# Patient Record
Sex: Female | Born: 1960 | Race: Black or African American | Hispanic: No | Marital: Married | Smoking: Never smoker
Health system: Southern US, Community
[De-identification: ages and names within clinical notes are randomized; demographics above are authoritative.]

## PROBLEM LIST (undated history)

## (undated) HISTORY — PX: TUBAL LIGATION: SHX77

## (undated) HISTORY — PX: DILATION AND CURETTAGE OF UTERUS: SHX78

---

## 1997-10-23 ENCOUNTER — Inpatient Hospital Stay (HOSPITAL_COMMUNITY): Admission: AD | Admit: 1997-10-23 | Discharge: 1997-10-23 | Payer: Self-pay | Admitting: *Deleted

## 1997-10-28 ENCOUNTER — Inpatient Hospital Stay (HOSPITAL_COMMUNITY): Admission: AD | Admit: 1997-10-28 | Discharge: 1997-10-28 | Payer: Self-pay | Admitting: *Deleted

## 1997-11-06 ENCOUNTER — Inpatient Hospital Stay (HOSPITAL_COMMUNITY): Admission: AD | Admit: 1997-11-06 | Discharge: 1997-11-06 | Payer: Self-pay | Admitting: Obstetrics

## 1997-11-15 ENCOUNTER — Inpatient Hospital Stay (HOSPITAL_COMMUNITY): Admission: AD | Admit: 1997-11-15 | Discharge: 1997-11-15 | Payer: Self-pay | Admitting: *Deleted

## 1997-11-19 ENCOUNTER — Inpatient Hospital Stay (HOSPITAL_COMMUNITY): Admission: AD | Admit: 1997-11-19 | Discharge: 1997-11-19 | Payer: Self-pay | Admitting: Obstetrics & Gynecology

## 1997-11-24 ENCOUNTER — Encounter (HOSPITAL_COMMUNITY): Admission: RE | Admit: 1997-11-24 | Discharge: 1997-12-03 | Payer: Self-pay | Admitting: *Deleted

## 1997-12-01 ENCOUNTER — Inpatient Hospital Stay (HOSPITAL_COMMUNITY): Admission: AD | Admit: 1997-12-01 | Discharge: 1997-12-05 | Payer: Self-pay | Admitting: *Deleted

## 1999-12-18 ENCOUNTER — Emergency Department (HOSPITAL_COMMUNITY): Admission: EM | Admit: 1999-12-18 | Discharge: 1999-12-18 | Payer: Self-pay | Admitting: Emergency Medicine

## 1999-12-18 ENCOUNTER — Encounter: Payer: Self-pay | Admitting: Emergency Medicine

## 2000-01-20 ENCOUNTER — Inpatient Hospital Stay (HOSPITAL_COMMUNITY): Admission: AD | Admit: 2000-01-20 | Discharge: 2000-01-20 | Payer: Self-pay | Admitting: *Deleted

## 2000-01-21 ENCOUNTER — Ambulatory Visit (HOSPITAL_COMMUNITY): Admission: RE | Admit: 2000-01-21 | Discharge: 2000-01-21 | Payer: Self-pay | Admitting: *Deleted

## 2000-01-21 ENCOUNTER — Encounter (INDEPENDENT_AMBULATORY_CARE_PROVIDER_SITE_OTHER): Payer: Self-pay | Admitting: Specialist

## 2000-01-21 ENCOUNTER — Encounter: Payer: Self-pay | Admitting: *Deleted

## 2000-01-27 ENCOUNTER — Inpatient Hospital Stay (HOSPITAL_COMMUNITY): Admission: AD | Admit: 2000-01-27 | Discharge: 2000-01-27 | Payer: Self-pay | Admitting: Obstetrics and Gynecology

## 2000-02-11 ENCOUNTER — Ambulatory Visit (HOSPITAL_COMMUNITY): Admission: AD | Admit: 2000-02-11 | Discharge: 2000-02-11 | Payer: Self-pay | Admitting: *Deleted

## 2000-02-11 ENCOUNTER — Encounter (INDEPENDENT_AMBULATORY_CARE_PROVIDER_SITE_OTHER): Payer: Self-pay

## 2001-03-23 ENCOUNTER — Emergency Department (HOSPITAL_COMMUNITY): Admission: EM | Admit: 2001-03-23 | Discharge: 2001-03-23 | Payer: Self-pay | Admitting: Emergency Medicine

## 2001-05-14 ENCOUNTER — Encounter: Admission: RE | Admit: 2001-05-14 | Discharge: 2001-05-14 | Payer: Self-pay | Admitting: Obstetrics & Gynecology

## 2002-12-01 ENCOUNTER — Encounter: Payer: Self-pay | Admitting: Family Medicine

## 2002-12-01 ENCOUNTER — Inpatient Hospital Stay (HOSPITAL_COMMUNITY): Admission: AD | Admit: 2002-12-01 | Discharge: 2002-12-01 | Payer: Self-pay | Admitting: Family Medicine

## 2003-01-29 ENCOUNTER — Inpatient Hospital Stay (HOSPITAL_COMMUNITY): Admission: AD | Admit: 2003-01-29 | Discharge: 2003-01-29 | Payer: Self-pay | Admitting: *Deleted

## 2003-02-17 ENCOUNTER — Ambulatory Visit (HOSPITAL_COMMUNITY): Admission: RE | Admit: 2003-02-17 | Discharge: 2003-02-17 | Payer: Self-pay | Admitting: Obstetrics and Gynecology

## 2003-04-27 ENCOUNTER — Encounter: Payer: Self-pay | Admitting: *Deleted

## 2003-04-27 ENCOUNTER — Ambulatory Visit (HOSPITAL_COMMUNITY): Admission: RE | Admit: 2003-04-27 | Discharge: 2003-04-27 | Payer: Self-pay | Admitting: *Deleted

## 2003-05-22 ENCOUNTER — Inpatient Hospital Stay (HOSPITAL_COMMUNITY): Admission: AD | Admit: 2003-05-22 | Discharge: 2003-05-22 | Payer: Self-pay | Admitting: Obstetrics & Gynecology

## 2003-06-05 ENCOUNTER — Ambulatory Visit (HOSPITAL_COMMUNITY): Admission: RE | Admit: 2003-06-05 | Discharge: 2003-06-05 | Payer: Self-pay | Admitting: *Deleted

## 2003-06-09 ENCOUNTER — Inpatient Hospital Stay (HOSPITAL_COMMUNITY): Admission: AD | Admit: 2003-06-09 | Discharge: 2003-06-09 | Payer: Self-pay | Admitting: *Deleted

## 2003-06-28 ENCOUNTER — Inpatient Hospital Stay (HOSPITAL_COMMUNITY): Admission: AD | Admit: 2003-06-28 | Discharge: 2003-06-28 | Payer: Self-pay | Admitting: Obstetrics and Gynecology

## 2003-07-21 ENCOUNTER — Encounter: Admission: RE | Admit: 2003-07-21 | Discharge: 2003-07-21 | Payer: Self-pay | Admitting: *Deleted

## 2003-07-22 ENCOUNTER — Inpatient Hospital Stay (HOSPITAL_COMMUNITY): Admission: RE | Admit: 2003-07-22 | Discharge: 2003-07-26 | Payer: Self-pay | Admitting: *Deleted

## 2003-07-23 ENCOUNTER — Encounter (INDEPENDENT_AMBULATORY_CARE_PROVIDER_SITE_OTHER): Payer: Self-pay

## 2004-04-11 ENCOUNTER — Emergency Department (HOSPITAL_COMMUNITY): Admission: EM | Admit: 2004-04-11 | Discharge: 2004-04-12 | Payer: Self-pay | Admitting: *Deleted

## 2005-04-04 ENCOUNTER — Emergency Department (HOSPITAL_COMMUNITY): Admission: EM | Admit: 2005-04-04 | Discharge: 2005-04-04 | Payer: Self-pay | Admitting: Family Medicine

## 2006-06-09 ENCOUNTER — Emergency Department (HOSPITAL_COMMUNITY): Admission: EM | Admit: 2006-06-09 | Discharge: 2006-06-09 | Payer: Self-pay | Admitting: Emergency Medicine

## 2006-10-20 ENCOUNTER — Emergency Department (HOSPITAL_COMMUNITY): Admission: EM | Admit: 2006-10-20 | Discharge: 2006-10-20 | Payer: Self-pay | Admitting: Emergency Medicine

## 2007-02-04 ENCOUNTER — Emergency Department (HOSPITAL_COMMUNITY): Admission: EM | Admit: 2007-02-04 | Discharge: 2007-02-05 | Payer: Self-pay | Admitting: Emergency Medicine

## 2011-01-13 NOTE — Op Note (Addendum)
NAME:  Angelena Form NO.:  [id number]   MEDICAL RECORD NO.:  [id number]                   PATIENT TYPE:  INP   LOCATION:  9141                                 FACILITY:  [facility]   PHYSICIAN:  Lesly Dukes, M.D.              DATE OF BIRTH:  1961-05-30   DATE OF PROCEDURE:  07/23/2003  DATE OF DISCHARGE:                                 OPERATIVE REPORT   PREOPERATIVE DIAGNOSIS:  Para 5-0-2-5 at 40+ weeks estimated gestational  age, with failed VBAC, LGA and fetus.   POSTOPERATIVE DIAGNOSIS:  Para 5-0-2-5 at 40+ weeks estimated gestational  age, with failed VBAC, LGA and fetus.   PROCEDURE:  Repeat low-flap transverse cesarean section with a bilateral  tubal ligation.   SURGEON:  Lesly Dukes, M.D.   ASSISTANT:  Carrington Clamp, M.D.   ESTIMATED BLOOD LOSS:  1000 cc.   COMPLICATIONS:  None.  ____ QA MARKER: 31 ____        None.                                               Lesly Dukes, M.D.    Lora Paula  D:  07/23/2003  T:  07/23/2003  Job:  [id number]

## 2011-01-13 NOTE — Op Note (Signed)
[facility]  Patient:    Vanessa Dodson                    MRN: [id number] Proc. Date: 02/01/00 Adm. Date:  [id number] Disc. Date: [id number] Attending:  Michaelle Copas                           Operative Report  PREOPERATIVE DIAGNOSIS:       Incomplete abortion.  POSTOPERATIVE DIAGNOSIS:      Incomplete abortion.  PROCEDURE:                    Suction curettage.  SURGEON:                      Miguel Aschoff, M.D.  ANESTHESIA:                   IV sedation and paracervical block.  COMPLICATIONS:                None.  JUSTIFICATION:                The patient is a 50 year old black female who has been followed at the outpatient area because of suspected spontaneous abortion.  She was noted to have falling hCG, which fell from 24,712 down to 330.  However, she had continued to have heavy bleeding, passing clots of blood and soaking a pad every 1-2 hours.  In view of the persistent bleeding, she is suspected to have retained products of conception.  She presents now to undergo suction curettage.  DESCRIPTION OF PROCEDURE:     The patient was taken to the operating room and placed in the supine position.  IV sedation was achieved without difficulty. She was then placed in the dorsal lithotomy position, prepped and draped in the usual sterile fashion.  The external genitalia were noted to be within normal limits.  A speculum was placed in the vaginal vault.  A moderate amount of bleeding was noted.  The cervical os was open.  The uterus was examined. It was noted to be top normal size and anterior.  No adnexal masses were noted.  The cervix was then injected with 10 cc of 1% Xylocaine by placing 3.5 cc at the 12, 4 and 8 oclock positions.  The anterior cervical lip was grasped with a tenaculum.  A #29 Pratt dilator was placed through the cervix, which was noted to be previously open.  A #9 vacuum curet was placed in the uterine cavity and the  contents were evacuated without difficulty.   This yielded a moderate amount of what appeared to be normal appearing retained products of conception.  Following suction curettage, sharp curettage was carried out without difficulty with no visible amount of additional tissue being found.  A final pass was made with the suction curet.  At this point, all instruments were removed.  Hemostasis was readily achieved.  The procedure was completed.  Estimated blood loss for the procedure was approximately 60 cc.  The patient tolerated the procedure well.  Plans are for the patient to be discharged to home.  Medications will include doxycycline 100 mg bis x 4 days and Darvocet-N 100, one q.4-6h. p.r.n. pain.  She is to call for any problems such as fever, pain or heavy bleeding.  She will be seen back in four weeks for a follow-up examination. DD:  02/11/00 TD:  02/14/00 Job: [id number]

## 2011-01-13 NOTE — Discharge Summary (Signed)
NAME:  Lemont Fillers                     ACCOUNT NO.:  [id number]   MEDICAL RECORD NO.:  [id number]                   PATIENT TYPE:  INP   LOCATION:  9141                                 FACILITY:  [facility]   PHYSICIAN:  Conni Elliot, M.D.             DATE OF BIRTH:  25-Dec-1960   DATE OF ADMISSION:  07/22/2003  DATE OF DISCHARGE:  07/26/2003                                 DISCHARGE SUMMARY   DISCHARGE DIAGNOSES:  1. Repeat low transverse cesarean section secondary to large for gestational     age infant and secondary to no cervical change for three hours.  2. Tubal ligation.   DISCHARGE MEDICATIONS:  1. Percocet 5/325 p.o. q.4-6h. p.r.n. #20.  2. Ibuprofen 600 mg one p.o. q.6h. p.r.n. #30.   DISPOSITION:  The patient discharged to home.   FOLLOWUP:  The patient is to follow up at [facility] in six weeks.   BRIEF ADMISSION HISTORY:  A 50 year old G9, P6-0-3-6 now admitted from  clinic for induction for large gestational age and post dates at [redacted] weeks  gestational age.  The patient was started on low dose Pitocin.  Had no  cervical change for a three hour period.  Failed vaginal birth and decided  to proceed to repeat low transverse cesarean section __________ tubal  ligation.  The patient delivered a viable female infant with Apgars of 9 and 9  at one and five minutes, respectively weighing 10 pounds 3 ounces and 54.5  cm in length.  The patient had some postoperative vertical nystagmus which  was believed by anesthesia to be secondary to Robinul.  It resolved in the  PACU and the patient had no further episodes of this during hospitalization.  The patient is breast-feeding with bottle supplementation.  Child was  circumcised before discharge.  Discharge hemoglobin was 10.4.  Therefore,  patient was not started on iron.  The patient's blood type O+.  Rubella  immune.  The patient was group B Strep positive with this pregnancy.     Anastasio Auerbach, MD                           Conni Elliot, M.D.    AD/MEDQ  D:  07/26/2003  T:  07/26/2003  Job:  [id number]

## 2011-01-13 NOTE — Op Note (Signed)
NAME:  Vanessa Dodson                     ACCOUNT NO.:  [id number]   MEDICAL RECORD NO.:  [id number]                   PATIENT TYPE:  INP   LOCATION:  9141                                 FACILITY:  [facility]   PHYSICIAN:  Lesly Dukes, M.D.              DATE OF BIRTH:  04-09-1961   DATE OF PROCEDURE:  07/23/2003  DATE OF DISCHARGE:  07/26/2003                                 OPERATIVE REPORT   REDICTATION:   PREOPERATIVE DIAGNOSIS:  A para 5-0-2-5 at 40+ weeks estimated gestational  age with a failed VBAC.   POSTOPERATIVE DIAGNOSIS:  A para 5-0-2-5 at 40+ weeks estimated gestational  age with a failed VBAC.   PROCEDURE:  Repeat low flap transverse cesarean section and BTL via the  Pomeroy method.   SURGEON:  Lesly Dukes, M.D.   ASSISTANT:  Carrington Clamp, M.D.   ESTIMATED BLOOD LOSS:  1000 mL.   COMPLICATIONS:  None.   PATHOLOGY:  Portions of bilateral fallopian tubes.   FINDINGS:  Viable female infant, weighing 10 pounds, 3 ounces, vertex, normal  placenta grossly with three-vessel cord, clear delivery.   DESCRIPTION OF PROCEDURE:  After informed consent was obtained, the patient  was taken to the operating room where spinal anesthesia was found to be  adequate.  The patient was placed in the dorsal supine position and prepared  and draped in the normal sterile fashion.  Foley was in the bladder.  A  Pfannenstiel skin incision was made with the scalpel and carried down to the  under layer of fascia.  The fascia was incised in the midline and extended  bilaterally.  Superior and inferior aspects of the fascial incisions were  grasped with Kocher clamps, tented up, and dissected off sharply and  bluntly.  The underlying layers of rectus muscles were separated in the  midline.  The peritoneum was identified, tented up, and entered sharply with  Metzenbaum scissors.  This incision was extended both superiorly and  inferiorly with good visualization of the bladder.   The bladder blade was  inserted.  The vesicouterine peritoneum was identified, tented up, and  entered sharply with the Metzenbaum scissors.  This incision was extended  bilaterally, and a bladder flap was created digitally.  The bladder blade  was then inserted.  The lower uterine segment was incised in a transverse  fashion with a scalpel.  This was extended bilaterally with the bandage  scissors.  The baby's head delivered without incident.  The nose and mouth  were suctioned.  The rest of the body of the baby delivered easily.  The  cord was clamped and cut, and the baby was handed off to the awaiting  pediatrician.  Cord blood was sent for type and screen.  Placenta was  delivered spontaneously and had a three-vessel cord.  The uterus was  exteriorized and cleared of all clots and debris.  The uterine incision was  closed with  0 Vicryl in a running locked fashion.  Excellent hemostasis was  noted.  The tubal was then begun.  The right fallopian tube was tented up  with a Babcock clamp, doubly ligated with 0 plain suture, transected, and  found to be hemostatic.  The fallopian tube was tented up, its middle  portion brought up to the fimbriated end.  It was doubly ligated with 0  Vicryl and transected and also found to be hemostatic.  The portions of the  fallopian tubes were sent to pathology.  The uterus was returned to the  abdomen and noted to be hemostatic off tension.  The peritoneum was closed  with 2 chromic in a running fashion.  The fascia was closed with 0 Vicryl in  a running fashion.  Good hemostasis was noted in all layers.  Subcutaneous  tissue was copiously irrigated; skin was closed with staples.  The patient  tolerated the procedure well.  Sponge, lap, instrument, and needle counts  were correct x2.  The patient went to the recovery room in stable condition.                                               Lesly Dukes, M.D.    Lora Paula  D:  09/06/2003  T:   09/06/2003  Job:  [id number]

## 2011-06-15 LAB — URINALYSIS, ROUTINE W REFLEX MICROSCOPIC
Bilirubin Urine: NEGATIVE
Glucose, UA: NEGATIVE
Leukocytes, UA: NEGATIVE
Nitrite: NEGATIVE
Specific Gravity, Urine: 1.006
pH: 6.5

## 2011-06-15 LAB — BASIC METABOLIC PANEL
Chloride: 108
GFR calc non Af Amer: >60
Glucose, Bld: 97
Potassium: 3.7
Sodium: 141

## 2011-06-15 LAB — CBC
HCT: 38.1
Hemoglobin: 12.9
MCV: 93.5
WBC: 5.6

## 2011-06-15 LAB — PREGNANCY, URINE: Preg Test, Ur: NEGATIVE

## 2012-09-29 ENCOUNTER — Encounter (HOSPITAL_COMMUNITY): Payer: Self-pay

## 2012-09-29 ENCOUNTER — Emergency Department (HOSPITAL_COMMUNITY)
Admission: EM | Admit: 2012-09-29 | Discharge: 2012-09-29 | Disposition: A | Discharge status: Home / Self Care | Payer: Self-pay | Attending: Emergency Medicine | Admitting: Emergency Medicine

## 2012-09-29 DIAGNOSIS — Z9889 Other specified postprocedural states: Secondary | ICD-10-CM | POA: Insufficient documentation

## 2012-09-29 DIAGNOSIS — N898 Other specified noninflammatory disorders of vagina: Secondary | ICD-10-CM | POA: Insufficient documentation

## 2012-09-29 DIAGNOSIS — Z3202 Encounter for pregnancy test, result negative: Secondary | ICD-10-CM | POA: Insufficient documentation

## 2012-09-29 DIAGNOSIS — N939 Abnormal uterine and vaginal bleeding, unspecified: Secondary | ICD-10-CM

## 2012-09-29 DIAGNOSIS — A5901 Trichomonal vulvovaginitis: Secondary | ICD-10-CM | POA: Insufficient documentation

## 2012-09-29 DIAGNOSIS — N949 Unspecified condition associated with female genital organs and menstrual cycle: Secondary | ICD-10-CM | POA: Insufficient documentation

## 2012-09-29 LAB — WET PREP, GENITAL: Yeast Wet Prep HPF POC: NONE SEEN

## 2012-09-29 LAB — CBC
HCT: 39.9 % (ref 36.0–46.0)
RBC: 4.31 MIL/uL (ref 3.87–5.11)
RDW: 12.3 % (ref 11.5–15.5)
WBC: 5.8 10*3/uL (ref 4.0–10.5)

## 2012-09-29 LAB — URINE MICROSCOPIC-ADD ON

## 2012-09-29 LAB — URINALYSIS, ROUTINE W REFLEX MICROSCOPIC
Glucose, UA: NEGATIVE mg/dL
Specific Gravity, Urine: 1.025 (ref 1.005–1.030)
Urobilinogen, UA: 0.2 mg/dL (ref 0.0–1.0)
pH: 5 (ref 5.0–8.0)

## 2012-09-29 LAB — PREGNANCY, URINE: Preg Test, Ur: NEGATIVE

## 2012-09-29 MED ORDER — METRONIDAZOLE 500 MG PO TABS
2000.0000 mg | ORAL_TABLET | Freq: Once | ORAL | Status: AC
  Administered 2012-09-29: 2000 mg via ORAL
  Filled 2012-09-29: qty 4

## 2012-09-29 NOTE — ED Notes (Signed)
Reports got marries in Jan 2013, did not have a normal menstrual cycle until May then started having exteremly light menstrual did not see an OBGYN. Last night abdominal cramping &  today gushing of blood with some clots.

## 2012-09-29 NOTE — ED Provider Notes (Signed)
Medical screening examination/treatment/procedure(s) were performed by non-physician practitioner and as supervising physician I was immediately available for consultation/collaboration.   Gilda Crease, MD 09/29/12 [phone]

## 2012-09-29 NOTE — ED Notes (Signed)
Patient c/o having a bloody discharge and pelvic cramping.

## 2012-09-29 NOTE — ED Provider Notes (Signed)
History     CSN: [id number]  Arrival date & time 09/29/12  1647   First MD Initiated Contact with Patient 09/29/12 1711      Chief Complaint  Patient presents with  . Vaginal Discharge  . Pelvic Pain    (Consider location/radiation/quality/duration/timing/severity/associated sxs/prior treatment) HPI Comments: Pt states that she started with abdominal cramping and vaginal bleeding today:pt states that has not had a period in six months:pt states that the blood was just running down her leg:pt denies n/v/diarrhea,fever or previous vaginal discharge  The history is provided by the patient. No language interpreter was used.    History reviewed. No pertinent past medical history.  Past Surgical History  Procedure Date  . Cesarean section   . Dilation and curettage of uterus     No family history on file.  History  Substance Use Topics  . Smoking status: Never Smoker   . Smokeless tobacco: Never Used  . Alcohol Use: No    OB History    Grav Para Term Preterm Abortions TAB SAB Ect Mult Living                  Review of Systems  Constitutional: Negative.   Respiratory: Negative.   Cardiovascular: Negative.     Allergies  Food  Home Medications  No current outpatient prescriptions on file.  BP 164/110  Pulse 66  Temp 98.2 F (36.8 C) (Oral)  Resp 20  SpO2 100%  LMP 03/29/2012  Physical Exam  Nursing note and vitals reviewed. Constitutional: She is oriented to person, place, and time. She appears well-developed and well-nourished.  HENT:  Head: Normocephalic and atraumatic.  Eyes: Conjunctivae normal and EOM are normal.  Neck: Neck supple.  Cardiovascular: Normal rate and regular rhythm.   Pulmonary/Chest: Effort normal and breath sounds normal.  Abdominal: Soft. Bowel sounds are normal. There is no tenderness.  Genitourinary:       Moderate amount of blood in the vaginal vault:-cmt  Musculoskeletal: Normal range of motion.  Neurological: She is alert  and oriented to person, place, and time.  Skin: Skin is warm and dry.  Psychiatric: She has a normal mood and affect.    ED Course  Procedures (including critical care time)  Labs Reviewed  URINALYSIS, ROUTINE W REFLEX MICROSCOPIC - Abnormal; Notable for the following:    Color, Urine RED (*)  BIOCHEMICALS MAY BE AFFECTED BY COLOR   APPearance TURBID (*)     Hgb urine dipstick LARGE (*)     Bilirubin Urine SMALL (*)     Ketones, ur TRACE (*)     Protein, ur 100 (*)     Leukocytes, UA SMALL (*)     All other components within normal limits  WET PREP, GENITAL - Abnormal; Notable for the following:    Trich, Wet Prep FEW (*)     WBC, Wet Prep HPF POC FEW (*)     All other components within normal limits  URINE MICROSCOPIC-ADD ON - Abnormal; Notable for the following:    Bacteria, UA MANY (*)     All other components within normal limits  PREGNANCY, URINE  CBC  GC/CHLAMYDIA PROBE AMP   No results found.   1. Trichomonal vaginitis   2. Vaginal bleeding problems       MDM  Discussed treatment of partners:pt treated with flagyl here        Teressa Lower, NP 09/29/12 1849

## 2012-09-30 LAB — GC/CHLAMYDIA PROBE AMP: GC Probe RNA: NEGATIVE

## 2013-05-27 ENCOUNTER — Encounter (HOSPITAL_COMMUNITY): Payer: Self-pay | Admitting: Adult Health

## 2013-05-27 ENCOUNTER — Emergency Department (HOSPITAL_COMMUNITY): Payer: No Typology Code available for payment source

## 2013-05-27 ENCOUNTER — Emergency Department (HOSPITAL_COMMUNITY)
Admission: EM | Admit: 2013-05-27 | Discharge: 2013-05-27 | Disposition: A | Discharge status: Home / Self Care | Payer: No Typology Code available for payment source | Attending: Emergency Medicine | Admitting: Emergency Medicine

## 2013-05-27 DIAGNOSIS — R5381 Other malaise: Secondary | ICD-10-CM | POA: Insufficient documentation

## 2013-05-27 DIAGNOSIS — R55 Syncope and collapse: Secondary | ICD-10-CM | POA: Insufficient documentation

## 2013-05-27 DIAGNOSIS — R0602 Shortness of breath: Secondary | ICD-10-CM | POA: Insufficient documentation

## 2013-05-27 DIAGNOSIS — R110 Nausea: Secondary | ICD-10-CM | POA: Insufficient documentation

## 2013-05-27 LAB — BASIC METABOLIC PANEL
CO2: 23 mEq/L (ref 19–32)
Glucose, Bld: 103 mg/dL — ABNORMAL HIGH (ref 70–99)
Potassium: 4.3 mEq/L (ref 3.5–5.1)
Sodium: 138 mEq/L (ref 135–145)

## 2013-05-27 LAB — CBC
Hemoglobin: 14.1 g/dL (ref 12.0–15.0)
MCH: 33 pg (ref 26.0–34.0)
RBC: 4.27 MIL/uL (ref 3.87–5.11)

## 2013-05-27 MED ORDER — NITROGLYCERIN 0.4 MG SL SUBL: 0.4000 mg | SUBLINGUAL_TABLET | SUBLINGUAL | Status: DC | PRN

## 2013-05-27 MED ORDER — SODIUM CHLORIDE 0.9 % IV BOLUS (SEPSIS)
1000.0000 mL | Freq: Once | INTRAVENOUS | Status: AC
  Administered 2013-05-27: 1000 mL via INTRAVENOUS

## 2013-05-27 NOTE — ED Notes (Signed)
Pt still receiving IV bolus, pt has about left. Pt ambulated to bathroom independently.

## 2013-05-27 NOTE — ED Notes (Addendum)
Presents with near syncope and sternal chest pressure that occurred at 16:00 today the near syncopal feeling was intermittent all day, but sitting down made it better, exertion made it worse associated with left hand numbness, SOB and nausea.  Nothing makes chest pain worse, rest and sitting down made pain better.  States, "right now I just a lot of pressure in my chest and my left arm hurts too and I have a headache" chest pressure radiates into left arm.

## 2013-05-27 NOTE — ED Provider Notes (Addendum)
  I performed a history and physical examination of Vanessa Dodson and discussed her management with Dr. Littie Deeds.  I agree with the history, physical, assessment, and plan of care, with the following exceptions: None  I was present for the following procedures: None Time Spent in Critical Care of the patient: None Time spent in discussions with the patient and family: 85  52 y.o. Female who felt weak and light headed today.  She denies chest pain as per the triage note.  She has a normal work up.    Holli Humbles, MD 05/27/13 2022  I have reviewed agree with ekg interpretation.   Hilario Quarry, MD 06/27/13 0730

## 2013-05-27 NOTE — ED Notes (Signed)
MD at bedside. 

## 2013-05-27 NOTE — ED Provider Notes (Signed)
CSN: [id number]     Arrival date & time 05/27/13  1708 History   First MD Initiated Contact with Patient 05/27/13 1805     Chief Complaint  Patient presents with  . Chest Pain   (Consider location/radiation/quality/duration/timing/severity/associated sxs/prior Treatment) Patient is a 52 y.o. female presenting with general illness.  Illness Location:  Generalized Quality:  Near syncope, shortness of breath Severity:  Moderate Onset quality:  Gradual Duration:  1 day Timing:  Constant Progression:  Worsening Chronicity:  New Context:  Spontaneous Relieved by:  Rest Worsened by:  Standing Associated symptoms: fatigue, nausea and shortness of breath   Associated symptoms: no abdominal pain, no chest pain, no congestion, no cough, no diarrhea, no fever, no headaches, no loss of consciousness, no rhinorrhea, no sore throat and no vomiting  Rash: spontaneous.     History reviewed. No pertinent past medical history. Past Surgical History  Procedure Laterality Date  . Cesarean section    . Dilation and curettage of uterus    . Tubal ligation     History reviewed. No pertinent family history. History  Substance Use Topics  . Smoking status: Never Smoker   . Smokeless tobacco: Never Used  . Alcohol Use: No   OB History   Grav Para Term Preterm Abortions TAB SAB Ect Mult Living                 Review of Systems  Constitutional: Positive for fatigue. Negative for fever and chills.  HENT: Negative for congestion, sore throat and rhinorrhea.   Eyes: Negative for photophobia and visual disturbance.  Respiratory: Positive for shortness of breath. Negative for cough.   Cardiovascular: Negative for chest pain and leg swelling.  Gastrointestinal: Positive for nausea. Negative for vomiting, abdominal pain, diarrhea and constipation.  Endocrine: Negative for polyphagia and polyuria.  Genitourinary: Negative for dysuria, flank pain, vaginal bleeding, vaginal discharge and enuresis.   Musculoskeletal: Negative for back pain and gait problem.  Skin: Negative for color change. Rash: spontaneous.  Neurological: Negative for dizziness, loss of consciousness, syncope, light-headedness, numbness and headaches.  Hematological: Negative for adenopathy. Does not bruise/bleed easily.  All other systems reviewed and are negative.    Allergies  Food  Home Medications   Current Outpatient Rx  Name  Route  Sig  Dispense  Refill  . aspirin 325 MG tablet   Oral   Take 650 mg by mouth daily as needed for pain.         . Multiple Vitamin (MULTIVITAMIN WITH MINERALS) TABS tablet   Oral   Take 1 tablet by mouth daily.          BP 169/102  Pulse 64  Temp(Src) 98.7 F (37.1 C) (Oral)  Resp 18  Wt 194 lb 1 oz (88.026 kg)  SpO2 99%  LMP 04/26/2013 Physical Exam  Vitals reviewed. Constitutional: She is oriented to person, place, and time. She appears well-developed and well-nourished.  HENT:  Head: Normocephalic and atraumatic.  Right Ear: External ear normal.  Left Ear: External ear normal.  Eyes: Conjunctivae and EOM are normal. Pupils are equal, round, and reactive to light.  Neck: Normal range of motion. Neck supple.  Cardiovascular: Normal rate, regular rhythm, normal heart sounds and intact distal pulses.   Pulmonary/Chest: Effort normal and breath sounds normal.  Abdominal: Soft. Bowel sounds are normal. There is no tenderness.  Musculoskeletal: Normal range of motion.  Neurological: She is alert and oriented to person, place, and time.  Skin: Skin is  warm and dry.    ED Course  Procedures (including critical care time) Labs Review Labs Reviewed  CBC - Abnormal; Notable for the following:    MCHC 36.4 (*)    All other components within normal limits  BASIC METABOLIC PANEL - Abnormal; Notable for the following:    Glucose, Bld 103 (*)    GFR calc non Af Amer 70 (*)    GFR calc Af Amer 81 (*)    All other components within normal limits   Imaging  Review Dg Chest 2 View  05/27/2013   *RADIOLOGY REPORT*  Clinical Data: Chest pain.  CHEST - 2 VIEW  Comparison: Chest radiograph 06/09/2006.  Findings: Stable cardiac and mediastinal contours.  No consolidative pulmonary opacities.  No pleural effusion or pneumothorax.  Regional skeleton is unremarkable.  IMPRESSION: No acute cardiopulmonary process.   Original Report Authenticated By: Annia Belt, M.D    Date: 05/27/2013  Rate: 96  Rhythm: normal sinus rhythm  QRS Axis: normal  Intervals: normal  ST/T Wave abnormalities: normal  Conduction Disutrbances:none  Narrative Interpretation: NSR without acute changes  Old EKG Reviewed: none available    MDM   1. Near syncope    52 y.o. 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  Pt had antecedent malaise and lightheadedness which progressed to near syncope, generalized weakness, and dyspnea described as chest tightness.  Pt denies chest pain, focal neuro complaints, vomiting, diarrhea, or pain.  She states that the current episode was like those she has had in the past, usually due to not drinking enough water and have been recurring since childhood, but denies any previous workup including holter monitoring.  Pt given NS bolus with improvement in symptoms, labs and ecg as above unremarkable.  Stable to dc home with pcp fu for possible holter monitoring.  Likely dehydration, also discussed possible etiology of cardiac arrhythmia or anxiety attack.  Given standard return precautions.     Labs and imaging as above reviewed by myself and attending,Dr. Rosalia Hammers, with whom case was discussed.   1. Near syncope         Noel Gerold, MD 05/27/13 2014

## 2013-06-02 ENCOUNTER — Encounter (HOSPITAL_COMMUNITY): Payer: Self-pay

## 2013-06-02 ENCOUNTER — Emergency Department (HOSPITAL_COMMUNITY)
Admission: EM | Admit: 2013-06-02 | Discharge: 2013-06-02 | Disposition: A | Discharge status: Home / Self Care | Payer: No Typology Code available for payment source | Attending: Emergency Medicine | Admitting: Emergency Medicine

## 2013-06-02 ENCOUNTER — Emergency Department (HOSPITAL_COMMUNITY): Payer: No Typology Code available for payment source

## 2013-06-02 DIAGNOSIS — R5381 Other malaise: Secondary | ICD-10-CM | POA: Insufficient documentation

## 2013-06-02 DIAGNOSIS — H53149 Visual discomfort, unspecified: Secondary | ICD-10-CM | POA: Insufficient documentation

## 2013-06-02 DIAGNOSIS — R002 Palpitations: Secondary | ICD-10-CM | POA: Insufficient documentation

## 2013-06-02 DIAGNOSIS — Z79899 Other long term (current) drug therapy: Secondary | ICD-10-CM | POA: Insufficient documentation

## 2013-06-02 DIAGNOSIS — R51 Headache: Secondary | ICD-10-CM | POA: Insufficient documentation

## 2013-06-02 DIAGNOSIS — F411 Generalized anxiety disorder: Secondary | ICD-10-CM | POA: Insufficient documentation

## 2013-06-02 MED ORDER — SODIUM CHLORIDE 0.9 % IV BOLUS (SEPSIS)
1000.0000 mL | Freq: Once | INTRAVENOUS | Status: AC
  Administered 2013-06-02: 1000 mL via INTRAVENOUS

## 2013-06-02 MED ORDER — PROCHLORPERAZINE EDISYLATE 5 MG/ML IJ SOLN
10.0000 mg | Freq: Once | INTRAMUSCULAR | Status: AC
  Administered 2013-06-02: 10 mg via INTRAVENOUS
  Filled 2013-06-02: qty 2

## 2013-06-02 MED ORDER — KETOROLAC TROMETHAMINE 30 MG/ML IJ SOLN
30.0000 mg | Freq: Once | INTRAMUSCULAR | Status: AC
  Administered 2013-06-02: 30 mg via INTRAVENOUS
  Filled 2013-06-02: qty 1

## 2013-06-02 NOTE — ED Notes (Addendum)
Patient states she was seen at [facility] ED for palpitations, SOB 3 days ago and now c/o right sided headache and numbness of her fingertips. Photophobia and noise make the headache worse.Marland Kitchen

## 2013-06-02 NOTE — ED Notes (Signed)
Patient denies international travel or being around anyone who has.

## 2013-06-02 NOTE — ED Provider Notes (Addendum)
CSN: [id number]     Arrival date & time 06/02/13  1191 History   First MD Initiated Contact with Patient 06/02/13 1121     Chief Complaint  Patient presents with  . Headache  . Weakness   (Consider location/radiation/quality/duration/timing/severity/associated sxs/prior Treatment) The history is provided by the patient.  Vanessa Dodson is a 52 y.o. female here presenting with headaches and palpitations. Was seen in the ER about week of palpitations. She had normal labs and sent home. Discharge from the ER she been having intermittent right-sided headaches and some photophobia. It's been intermittent and has not gone away. The right side of her head going down to her neck. Denies any fever chills. States that her right ear hurts a little as well.    History reviewed. No pertinent past medical history. Past Surgical History  Procedure Laterality Date  . Cesarean section    . Dilation and curettage of uterus    . Tubal ligation     Family History  Problem Relation Age of Onset  . Hypertension Mother   . Diabetes Other    History  Substance Use Topics  . Smoking status: Never Smoker   . Smokeless tobacco: Never Used  . Alcohol Use: No   OB History   Grav Para Term Preterm Abortions TAB SAB Ect Mult Living                 Review of Systems  Neurological: Positive for headaches.  All other systems reviewed and are negative.    Allergies  Food  Home Medications   Current Outpatient Rx  Name  Route  Sig  Dispense  Refill  . aspirin 325 MG tablet   Oral   Take 650 mg by mouth daily as needed for pain.         . Multiple Vitamin (MULTIVITAMIN WITH MINERALS) TABS tablet   Oral   Take 1 tablet by mouth daily.          BP 143/91  Pulse 66  Temp(Src) 98.6 F (37 C) (Oral)  Resp 16  Ht 5\' 7"  (1.702 m)  Wt 194 lb (87.998 kg)  BMI 30.38 kg/m2  SpO2 100%  LMP 06/01/2013 Physical Exam  Nursing note and vitals reviewed. Constitutional: She is  oriented to person, place, and time. She appears well-developed and well-nourished.  Anxious   HENT:  Head: Normocephalic and atraumatic.  Right Ear: External ear normal.  Left Ear: External ear normal.  Mouth/Throat: Oropharynx is clear and moist.  No evidence of periapical abscess in mouth. No tenderness at TMJ.   Eyes: Conjunctivae are normal. Pupils are equal, round, and reactive to light.  Neck: Normal range of motion. Neck supple.  Cardiovascular: Normal rate, regular rhythm and normal heart sounds.   Pulmonary/Chest: Effort normal and breath sounds normal. No respiratory distress. She has no wheezes. She has no rales.  Abdominal: Soft. Bowel sounds are normal. She exhibits no distension. There is no tenderness. There is no rebound and no guarding.  Musculoskeletal: Normal range of motion.  Neurological: She is alert and oriented to person, place, and time.  Skin: Skin is warm and dry.  Psychiatric: She has a normal mood and affect. Her behavior is normal. Judgment and thought content normal.    ED Course  Procedures (including critical care time) Labs Review Labs Reviewed - No data to display Imaging Review Ct Head Wo Contrast  06/02/2013   CLINICAL DATA:  Severe posterior headache.  EXAM: CT  HEAD WITHOUT CONTRAST  TECHNIQUE: Contiguous axial images were obtained from the base of the skull through the vertex without intravenous contrast.  COMPARISON:  CT head without contrast the 04/12/2004.  FINDINGS: No acute cortical infarct, hemorrhage, or mass lesion is present. Subcortical white matter changes are or prominent posterior to the atria of the lateral ventricles. This is similar to the prior exam. The paranasal sinuses and mastoid air cells are clear. The osseous skull is intact.  IMPRESSION: 1. Subcortical white matter hypoattenuation a is predominantly posterior, raising the possibility posterior reversible encephalopathy syndrome. 2. No other acute abnormality.   Electronically  Signed   By: Gennette Pac M.D.   On: 06/02/2013 12:46    MDM  No diagnosis found. Vanessa Dodson is a 52 y.o. female here with tension headache. I am not concerned for subarachnoid. I think her symptoms are exacerbated by anxiety, just as a week ago. She is requesting imaging. I counseled her that imaging is not necessary and that she should get MRI outpatient and neuro f/u. She wanted a CT after understanding and risks and benefits. Will give headache cocktail as well.  She also still has palpitations and I told her that she should get outpatient holter monitor.   2:08 PM CT showed chronic changes. No signs of acute stroke. Possible PRES syndrome. However, her BP was in the 140s in the ED and unchanged from previous visits. She is also not altered so I doubt true PRES. Headache improved with migraine cocktail. Will refer her to neuro for possible MRI for further workup. Will have her get holter outpatient for palpitations.    Richardean Canal, MD 06/02/13 1409  Richardean Canal, MD 06/04/13 [phone]

## 2013-06-11 ENCOUNTER — Encounter: Payer: Self-pay | Admitting: Neurology

## 2013-06-11 ENCOUNTER — Ambulatory Visit (INDEPENDENT_AMBULATORY_CARE_PROVIDER_SITE_OTHER): Payer: No Typology Code available for payment source | Admitting: Neurology

## 2013-06-11 VITALS — BP 142/93 | HR 87 | Ht 65.0 in | Wt 191.0 lb

## 2013-06-11 DIAGNOSIS — R51 Headache: Secondary | ICD-10-CM

## 2013-06-11 DIAGNOSIS — M542 Cervicalgia: Secondary | ICD-10-CM

## 2013-06-11 DIAGNOSIS — M62838 Other muscle spasm: Secondary | ICD-10-CM

## 2013-06-11 DIAGNOSIS — R519 Headache, unspecified: Secondary | ICD-10-CM | POA: Insufficient documentation

## 2013-06-11 MED ORDER — CYCLOBENZAPRINE HCL 5 MG PO TABS: 5.0000 mg | ORAL_TABLET | Freq: Every evening | ORAL | Status: AC | PRN

## 2013-06-11 NOTE — Progress Notes (Signed)
[facility]    Provider:  Dr Hosie Poisson Referring Provider: Richardean Canal, MD Primary Care Physician:  Pcp Not In System  CC:  headache  HPI:  Vanessa Dodson is a 52 y.o. female here as a referral from Dr. Silverio Lay for headche  Headaches started around 10 days ago, initialy started with palpitations, numbness in her fingers, placed on heart monitor, nothing found. Had root canal done last month,on left side. Headache is pulsating, with occasional electric shocks. No nausea or vomiting, notes she is sensitive to light and sound. No change in vision with the headaches. Notes some confusion with the headaches. Cannot get comfortable at night, states her whole head hurts, is unable to lie on her head. Has pain when she turns her head side to side. No discomfort with forward flexion/extension. Thinks she may have strained her neck slightly in the past few weeks.  No recent fevers, illnesses. Noted to have elevated BP 160-170s in ED recently.  Currently pain is 8/10,   No history of headaches in the past, no history of migraines.   Head CT reviewed showing possible posterior hypodensities.  Review of Systems: Out of a complete 14 system review, the patient complains of only the following symptoms, and all other reviewed systems are negative. Positive for blurred vision shortness of breath palpitations joint pain cramping headache numbness weakness sleepiness History   Social History  . Marital Status: Legally Separated    Spouse Name: Marguerita Merles    Number of Children: 6  . Years of Education: college   Occupational History  . 6    Social History Main Topics  . Smoking status: Never Smoker   . Smokeless tobacco: Never Used  . Alcohol Use: No  . Drug Use: No  . Sexual Activity: Not on file   Other Topics Concern  . Not on file   Social History Narrative   Patient lives at home with her husband Marguerita Merles.    Patient has 6 children.    Patient is employed.    Patient has some college.           Family History  Problem Relation Age of Onset  . Hypertension Mother   . Diabetes Other     History reviewed. No pertinent past medical history.  Past Surgical History  Procedure Laterality Date  . Cesarean section    . Dilation and curettage of uterus    . Tubal ligation      Current Outpatient Prescriptions  Medication Sig Dispense Refill  . aspirin 325 MG tablet Take 650 mg by mouth daily as needed for pain.      . IBUPROFEN PO Take by mouth as needed.      . Multiple Vitamin (MULTIVITAMIN WITH MINERALS) TABS tablet Take 1 tablet by mouth daily.       No current facility-administered medications for this visit.    Allergies as of 06/11/2013 - Review Complete 06/11/2013  Allergen Reaction Noted  . Food Other (See Comments) 09/29/2012    Vitals: BP 142/93  Pulse 87  Ht 5\' 5"  (1.651 m)  Wt 191 lb (86.637 kg)  BMI 31.78 kg/m2  LMP 06/01/2013 Last Weight:  Wt Readings from Last 1 Encounters:  06/11/13 191 lb (86.637 kg)   Last Height:   Ht Readings from Last 1 Encounters:  06/11/13 5\' 5"  (1.651 m)     Physical exam: Exam: Gen: NAD, conversant Eyes: anicteric sclerae, moist conjunctivae HENT: Atraumatic, oropharynx clear Neck: Trachea midline;  supple,decreased ROM R to L with pain noted. Normal flexion/extension. Negative Kernig and Brudzinski signs  Lungs: CTA, no wheezing, rales, rhonic                          CV: RRR, no MRG Abdomen: Soft, non-tender;  Extremities: No peripheral edema  Skin: Normal temperature, no rash,  Psych: Appropriate affect, pleasant  Neuro: MS: AA&Ox3, appropriately interactive, normal affect   Speech: fluent w/o paraphasic error  Memory: good recent and remote recall  CN: PERRL, EOMI no nystagmus, no ptosis, sensation intact to LT V1-V3 bilat, face symmetric, no weakness, hearing grossly intact, palate elevates symmetrically, shoulder shrug 5/5 bilat,  tongue protrudes midline, no  fasiculations noted.  Motor: normal bulk and tone Strength: 5/5  In all extremities  Coord: rapid alternating and point-to-point (FNF, HTS) movements intact.  Reflexes: symmetrical, bilat downgoing toes  Sens: LT intact in all extremities  Gait: posture, stance, stride and arm-swing normal. Tandem gait intact. Able to walk on heels and toes. Romberg absent.   Assessment:  After physical and neurologic examination, review of laboratory studies, imaging, neurophysiology testing and pre-existing records, assessment will be reviewed on the problem list.  Plan:  Treatment plan and additional workup will be reviewed under Problem List.  1)Headache 2)Cervical neck pain  51y/o presenting for headache evaluation. Physical exam pertinent only for decreased R to L ROM in neck related to pain. CT head shows possible posterior hypoattenuation r/o PRES. Unclear etiology of symptoms. Suspect headache is likely cervicogenic and related to muscle sprain. Based on CT and elevated BP in ED cannot r/o PRES. Neck stiffness also raises question of infectious etiology, though otherwise normal appearance and exam makes this less likely. Will check brain MRI to r/o PRES. PT referral and muscle relaxant prescribed. Do not think LP indicated at this time as low suspicion for infectious etiology, would consider in the future if no improvement or if worsening noted. Will check CBC today. Follow up as needed.

## 2013-06-11 NOTE — Patient Instructions (Signed)
Overall you are doing fairly well but I do want to suggest a few things today:   Remember to drink plenty of fluid, eat healthy meals and do not skip any meals. Try to eat protein with a every meal and eat a healthy snack such as fruit or nuts in between meals. Try to keep a regular sleep-wake schedule and try to exercise daily, particularly in the form of walking, 20-30 minutes a day, if you can.   As far as your medications are concerned, I would like to suggest trying a medication called Flexeril 5mg . Please take this once nightly as needed to relieve your muscle spasms.   As far as diagnostic testing: I would like to get a MRI of your head.   We gave you a referral for physical therapy to help relieve your neck pain.   Please also call us for any test results so we can go over those with you on the phone.  My clinical assistant and will answer any of your questions and relay your messages to me and also relay most of my messages to you.   Our phone number is [phone]. We also have an after hours call service for urgent matters and there is a physician on-call for urgent questions. For any emergencies you know to call 911 or go to the nearest emergency room

## 2013-06-12 LAB — CBC WITH DIFFERENTIAL/PLATELET
Basos: 0 %
Eosinophils Absolute: 0.1 10*3/uL (ref 0.0–0.4)
HCT: 36.3 % (ref 34.0–46.6)
Hemoglobin: 12.4 g/dL (ref 11.1–15.9)
MCH: 31.8 pg (ref 26.6–33.0)
MCV: 93 fL (ref 79–97)
Monocytes Absolute: 0.8 10*3/uL (ref 0.1–0.9)
Neutrophils Absolute: 3 10*3/uL (ref 1.4–7.0)
RBC: 3.9 x10E6/uL (ref 3.77–5.28)

## 2013-06-13 ENCOUNTER — Telehealth: Payer: Self-pay | Admitting: Neurology

## 2013-06-13 NOTE — Telephone Encounter (Signed)
Please let her know her blood work was normal. Thanks.

## 2013-06-13 NOTE — Telephone Encounter (Signed)
I called pt and relayed the lab results to pt.  She verbalized understanding.

## 2013-06-17 ENCOUNTER — Ambulatory Visit: Payer: No Typology Code available for payment source

## 2013-07-03 ENCOUNTER — Ambulatory Visit
Admission: RE | Admit: 2013-07-03 | Discharge: 2013-07-03 | Disposition: A | Discharge status: Home / Self Care | Payer: No Typology Code available for payment source | Source: Ambulatory Visit | Attending: Neurology | Admitting: Neurology

## 2013-07-03 DIAGNOSIS — R51 Headache: Secondary | ICD-10-CM

## 2013-07-07 NOTE — Progress Notes (Signed)
Quick Note:  Left message that MRI brain was unremarkable, per Dr. Hosie Poisson. Told to call with any questions. ______

## 2014-12-30 IMAGING — CT CT HEAD W/O CM
2 series · 16 of 30 positions shown, 20 images · non-contrast
Comparison: CT head without contrast the 04/12/2004.

CLINICAL DATA: Severe posterior headache.

EXAM:
CT HEAD WITHOUT CONTRAST
TECHNIQUE: Contiguous axial images were obtained from the base of the skull
through the vertex without intravenous contrast.

[Series 2: head w/o · axial · non-contrast · 0.48mm/px · z∈[-107,+13]mm · 13 of 28 slices shown, 17 images]
[im 2/28  brain]
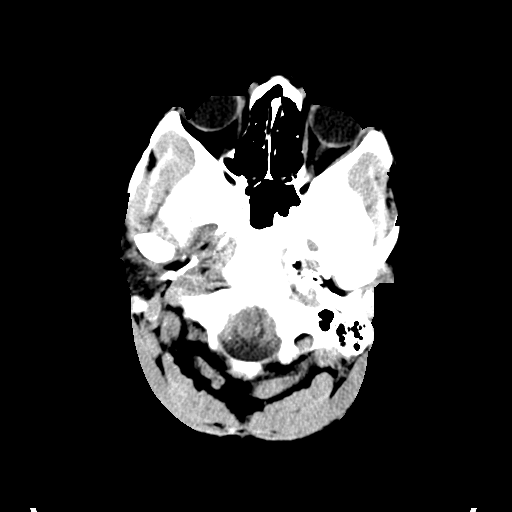
[im 2/28  bone]
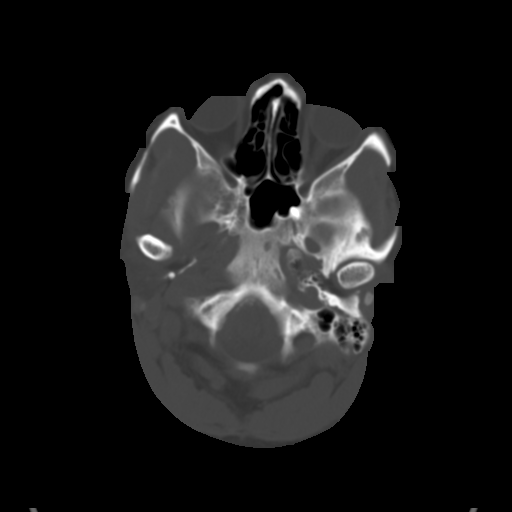
[im 4/28  brain]
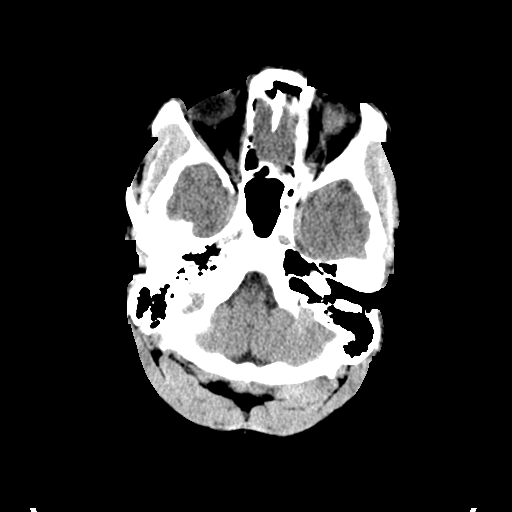
[im 6/28  brain]
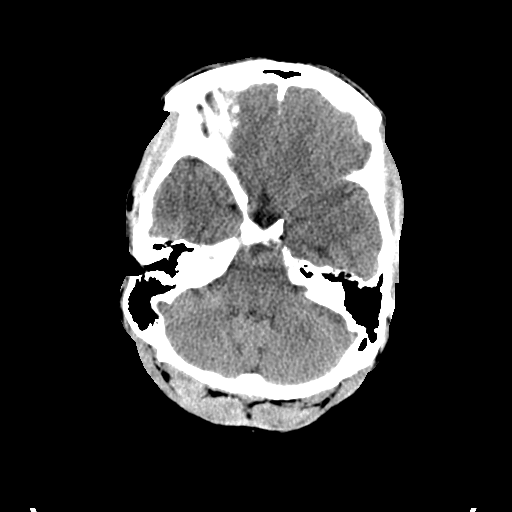
[im 8/28  brain]
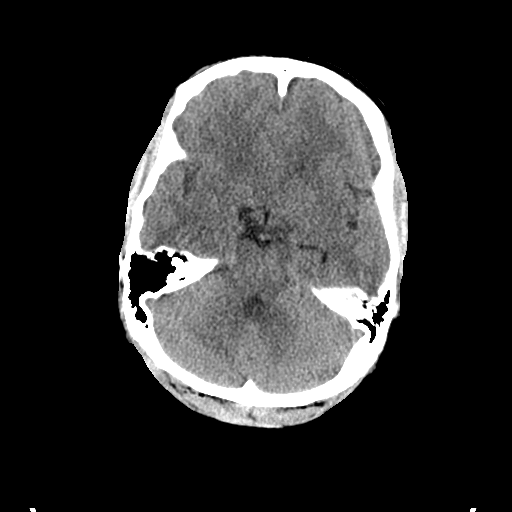
[im 10/28  brain]
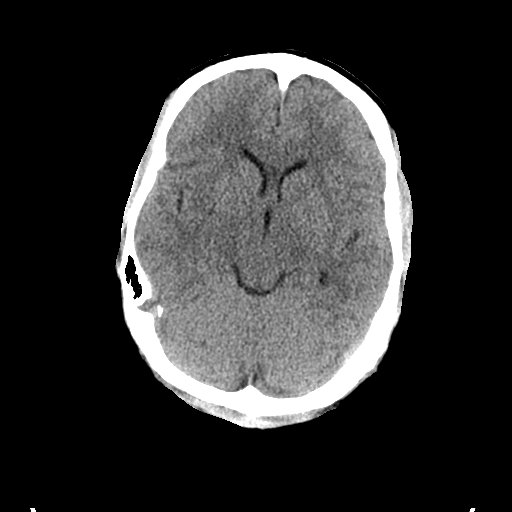
[im 10/28  bone]
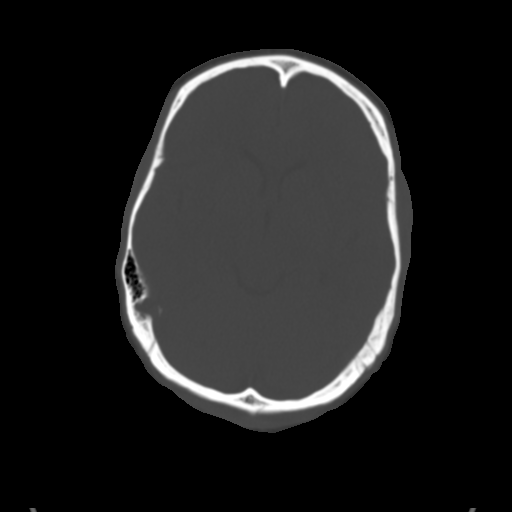
[im 12/28  brain]
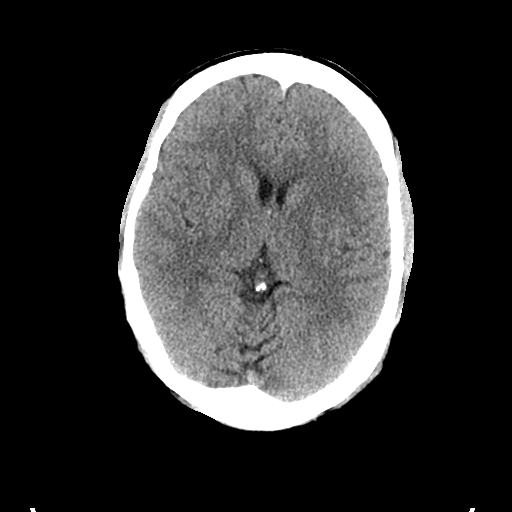
[im 14/28  brain]
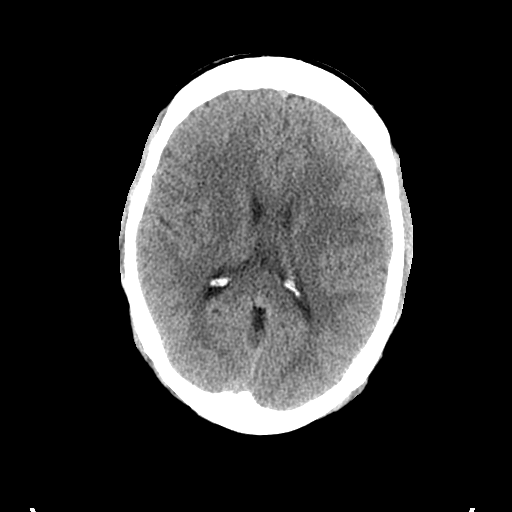
[im 16/28  brain]
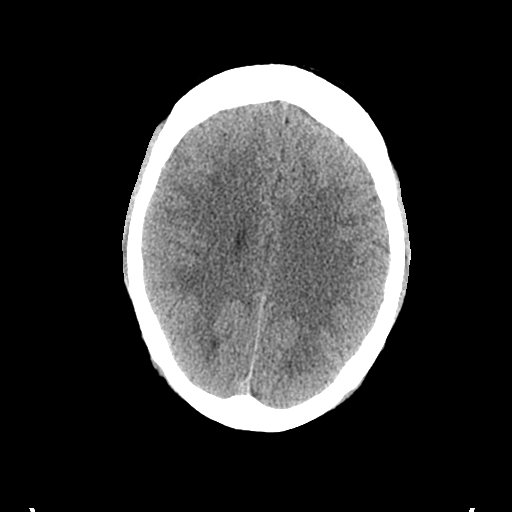
[im 18/28  brain]
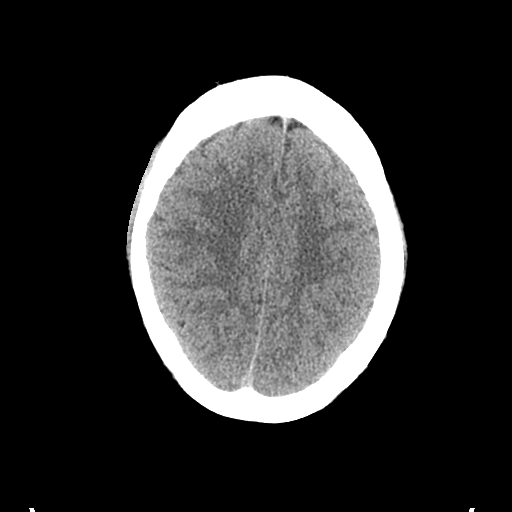
[im 18/28  bone]
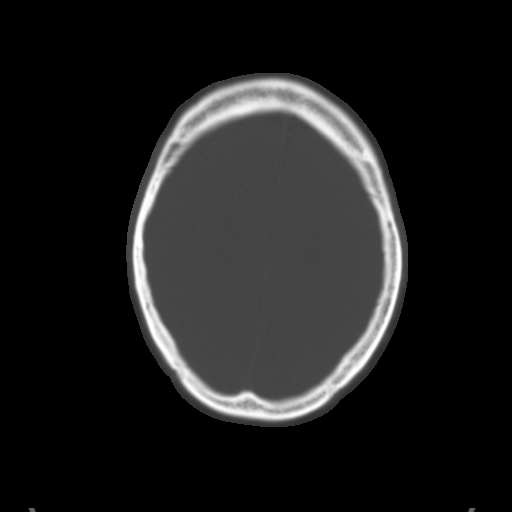
[im 20/28  brain]
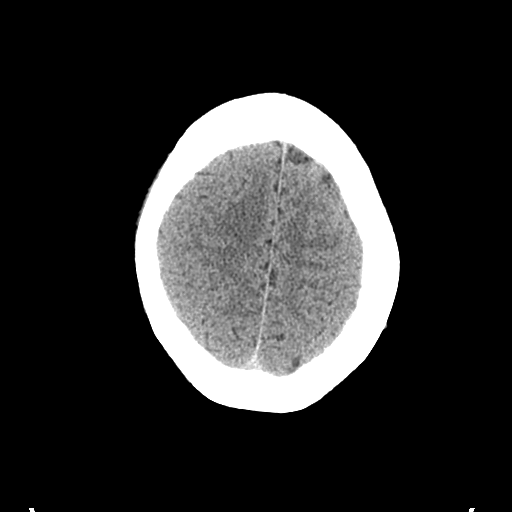
[im 22/28  brain]
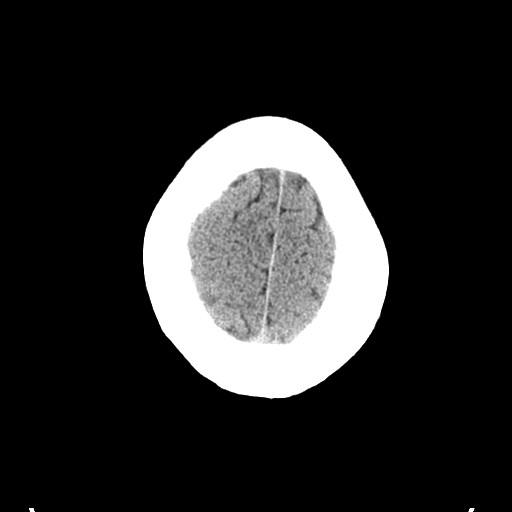
[im 24/28  brain]
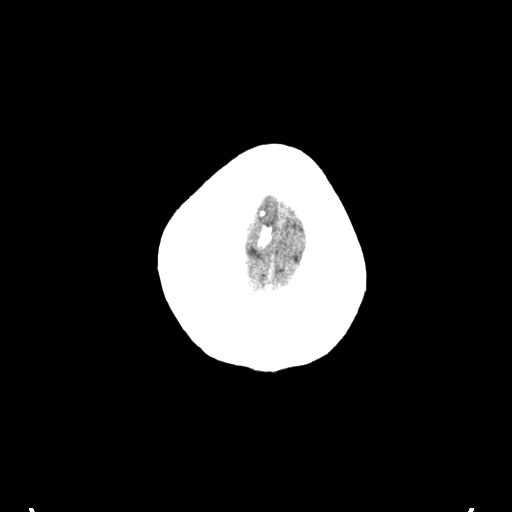
[im 26/28  brain]
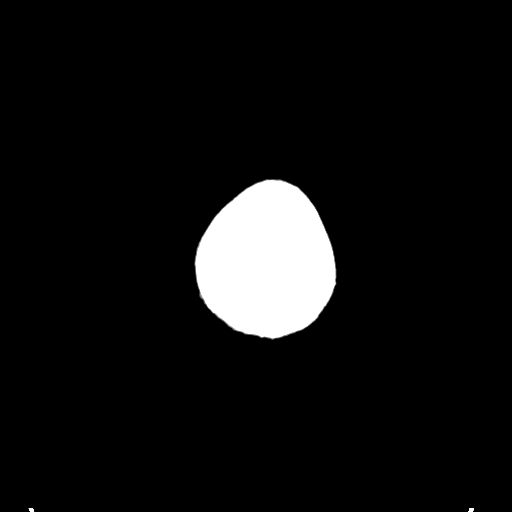
[im 26/28  bone]
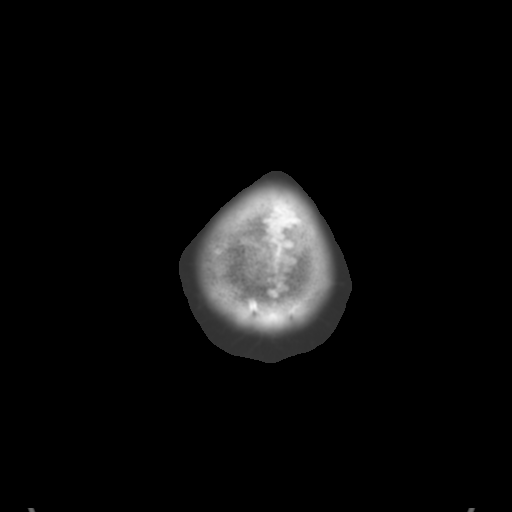

[Series 3: bone windows · axial · 0.48mm/px · z∈[-107,-67]mm · 3 of 28 slices shown]
[im 2/28  bone]
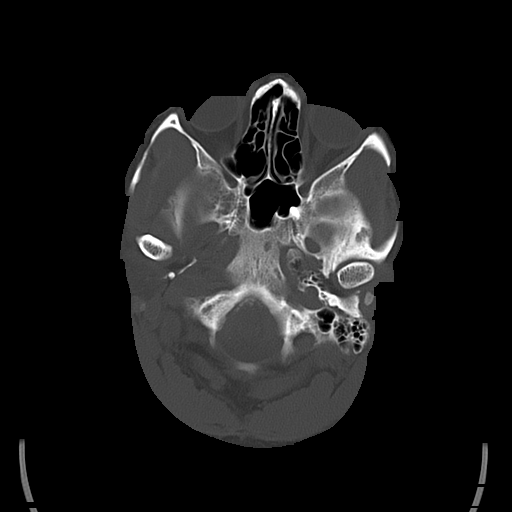
[im 6/28  bone]
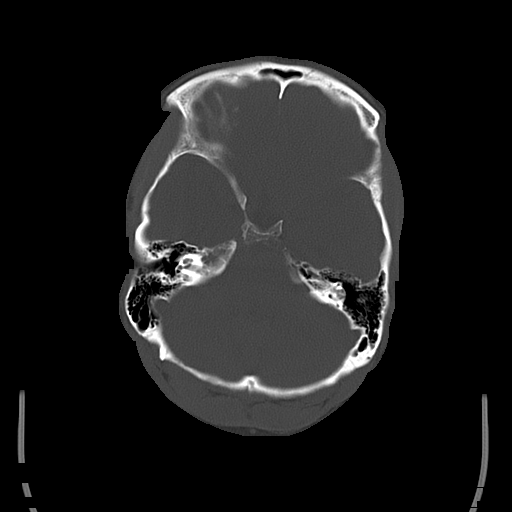
[im 10/28  bone]
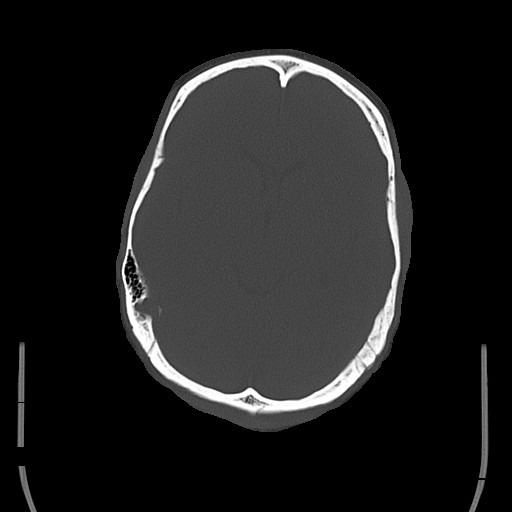

[16 of 30 positions shown; findings below may reference images not displayed]

FINDINGS: No acute cortical infarct, hemorrhage, or mass lesion is present.
Subcortical white matter changes are or prominent posterior to the
atria of the lateral ventricles. This is similar to the prior exam.
The paranasal sinuses and mastoid air cells are clear. The osseous
skull is intact.
IMPRESSION: 1. Subcortical white matter hypoattenuation a is predominantly
posterior, raising the possibility posterior reversible
encephalopathy syndrome.
2. No other acute abnormality.

## 2015-07-05 ENCOUNTER — Encounter (HOSPITAL_COMMUNITY): Payer: Self-pay | Admitting: Emergency Medicine

## 2015-07-05 ENCOUNTER — Emergency Department (HOSPITAL_COMMUNITY): Payer: Self-pay

## 2015-07-05 ENCOUNTER — Emergency Department (HOSPITAL_COMMUNITY)
Admission: EM | Admit: 2015-07-05 | Discharge: 2015-07-05 | Disposition: A | Discharge status: Home / Self Care | Payer: Self-pay | Attending: Emergency Medicine | Admitting: Emergency Medicine

## 2015-07-05 DIAGNOSIS — N3000 Acute cystitis without hematuria: Secondary | ICD-10-CM | POA: Insufficient documentation

## 2015-07-05 DIAGNOSIS — J069 Acute upper respiratory infection, unspecified: Secondary | ICD-10-CM | POA: Insufficient documentation

## 2015-07-05 DIAGNOSIS — Z3202 Encounter for pregnancy test, result negative: Secondary | ICD-10-CM | POA: Insufficient documentation

## 2015-07-05 DIAGNOSIS — R0602 Shortness of breath: Secondary | ICD-10-CM | POA: Insufficient documentation

## 2015-07-05 DIAGNOSIS — K5900 Constipation, unspecified: Secondary | ICD-10-CM | POA: Insufficient documentation

## 2015-07-05 DIAGNOSIS — R55 Syncope and collapse: Secondary | ICD-10-CM | POA: Insufficient documentation

## 2015-07-05 LAB — COMPREHENSIVE METABOLIC PANEL
ALBUMIN: 3.8 g/dL (ref 3.5–5.0)
ALK PHOS: 57 U/L (ref 38–126)
ALT: 11 U/L — AB (ref 14–54)
AST: 17 U/L (ref 15–41)
Anion gap: 7 (ref 5–15)
BILIRUBIN TOTAL: 0.6 mg/dL (ref 0.3–1.2)
BUN: 8 mg/dL (ref 6–20)
CALCIUM: 9.2 mg/dL (ref 8.9–10.3)
CO2: 26 mmol/L (ref 22–32)
Chloride: 106 mmol/L (ref 101–111)
Creatinine, Ser: 0.9 mg/dL (ref 0.44–1.00)
GFR calc Af Amer: >60 mL/min (ref >60)
GFR calc non Af Amer: >60 mL/min (ref >60)
GLUCOSE: 106 mg/dL — AB (ref 65–99)
Potassium: 3.5 mmol/L (ref 3.5–5.1)
Sodium: 139 mmol/L (ref 135–145)
TOTAL PROTEIN: 6.9 g/dL (ref 6.5–8.1)

## 2015-07-05 LAB — URINE MICROSCOPIC-ADD ON

## 2015-07-05 LAB — PREGNANCY, URINE: Preg Test, Ur: NEGATIVE

## 2015-07-05 LAB — CBC WITH DIFFERENTIAL/PLATELET
BASOS ABS: 0 10*3/uL (ref 0.0–0.1)
BASOS PCT: 0 %
Eosinophils Absolute: 0.1 10*3/uL (ref 0.0–0.7)
Eosinophils Relative: 1 %
HEMATOCRIT: 40.1 % (ref 36.0–46.0)
HEMOGLOBIN: 13.3 g/dL (ref 12.0–15.0)
Lymphocytes Relative: 21 %
Lymphs Abs: 1.2 10*3/uL (ref 0.7–4.0)
MCH: 30.8 pg (ref 26.0–34.0)
MCHC: 33.2 g/dL (ref 30.0–36.0)
MCV: 92.8 fL (ref 78.0–100.0)
MONOS PCT: 13 %
Monocytes Absolute: 0.7 10*3/uL (ref 0.1–1.0)
NEUTROS ABS: 3.8 10*3/uL (ref 1.7–7.7)
NEUTROS PCT: 65 %
Platelets: 204 10*3/uL (ref 150–400)
RBC: 4.32 MIL/uL (ref 3.87–5.11)
RDW: 12.6 % (ref 11.5–15.5)
WBC: 5.9 10*3/uL (ref 4.0–10.5)

## 2015-07-05 LAB — URINALYSIS, ROUTINE W REFLEX MICROSCOPIC
Bilirubin Urine: NEGATIVE
GLUCOSE, UA: NEGATIVE mg/dL
HGB URINE DIPSTICK: NEGATIVE
Ketones, ur: NEGATIVE mg/dL
Nitrite: POSITIVE — AB
Protein, ur: NEGATIVE mg/dL
SPECIFIC GRAVITY, URINE: 1.024 (ref 1.005–1.030)
UROBILINOGEN UA: 1 mg/dL (ref 0.0–1.0)
pH: 6 (ref 5.0–8.0)

## 2015-07-05 LAB — D-DIMER, QUANTITATIVE: D-Dimer, Quant: <0.27 ug/mL-FEU (ref 0.00–0.48)

## 2015-07-05 LAB — I-STAT TROPONIN, ED: Troponin i, poc: 0 ng/mL (ref 0.00–0.08)

## 2015-07-05 MED ORDER — DOCUSATE SODIUM 100 MG PO CAPS: 100.0000 mg | ORAL_CAPSULE | Freq: Two times a day (BID) | ORAL | Status: AC

## 2015-07-05 MED ORDER — POLYETHYLENE GLYCOL 3350 17 G PO PACK: 17.0000 g | PACK | Freq: Every day | ORAL | Status: AC

## 2015-07-05 MED ORDER — DEXTROSE 5 % IV SOLN
1.0000 g | Freq: Once | INTRAVENOUS | Status: AC
  Administered 2015-07-05: 1 g via INTRAVENOUS
  Filled 2015-07-05: qty 10

## 2015-07-05 MED ORDER — SODIUM CHLORIDE 0.9 % IV BOLUS (SEPSIS)
1000.0000 mL | Freq: Once | INTRAVENOUS | Status: AC
  Administered 2015-07-05: 1000 mL via INTRAVENOUS

## 2015-07-05 MED ORDER — KETOROLAC TROMETHAMINE 30 MG/ML IJ SOLN
30.0000 mg | Freq: Once | INTRAMUSCULAR | Status: AC
  Administered 2015-07-05: 30 mg via INTRAVENOUS
  Filled 2015-07-05: qty 1

## 2015-07-05 MED ORDER — CEPHALEXIN 500 MG PO CAPS: 500.0000 mg | ORAL_CAPSULE | Freq: Four times a day (QID) | ORAL | Status: AC

## 2015-07-05 NOTE — ED Notes (Signed)
Pt reports cold-like symptoms such as sneezing, runny nose, headache and sore throat.  She reports that has also been dizzy, weak and near fainting spells.  She also adds that she is constipated.

## 2015-07-05 NOTE — ED Notes (Signed)
Patient denies pain and is resting comfortably.  

## 2015-07-05 NOTE — Discharge Instructions (Signed)
Constipation, Adult Constipation is when a person has fewer than three bowel movements a week, has difficulty having a bowel movement, or has stools that are dry, hard, or larger than normal. As people grow older, constipation is more common. A low-fiber diet, not taking in enough fluids, and taking certain medicines may make constipation worse.  CAUSES   Certain medicines, such as antidepressants, pain medicine, iron supplements, antacids, and water pills.   Certain diseases, such as diabetes, irritable bowel syndrome (IBS), thyroid disease, or depression.   Not drinking enough water.   Not eating enough fiber-rich foods.   Stress or travel.   Lack of physical activity or exercise.   Ignoring the urge to have a bowel movement.   Using laxatives too much.  SIGNS AND SYMPTOMS   Having fewer than three bowel movements a week.   Straining to have a bowel movement.   Having stools that are hard, dry, or larger than normal.   Feeling full or bloated.   Pain in the lower abdomen.   Not feeling relief after having a bowel movement.  DIAGNOSIS  Your health care provider will take a medical history and perform a physical exam. Further testing may be done for severe constipation. Some tests may include:  A barium enema X-ray to examine your rectum, colon, and, sometimes, your small intestine.   A sigmoidoscopy to examine your lower colon.   A colonoscopy to examine your entire colon. TREATMENT  Treatment will depend on the severity of your constipation and what is causing it. Some dietary treatments include drinking more fluids and eating more fiber-rich foods. Lifestyle treatments may include regular exercise. If these diet and lifestyle recommendations do not help, your health care provider may recommend taking over-the-counter laxative medicines to help you have bowel movements. Prescription medicines may be prescribed if over-the-counter medicines do not work.    HOME CARE INSTRUCTIONS   Eat foods that have a lot of fiber, such as fruits, vegetables, whole grains, and beans.  Limit foods high in fat and processed sugars, such as french fries, hamburgers, cookies, candies, and soda.   A fiber supplement may be added to your diet if you cannot get enough fiber from foods.   Drink enough fluids to keep your urine clear or pale yellow.   Exercise regularly or as directed by your health care provider.   Go to the restroom when you have the urge to go. Do not hold it.   Only take over-the-counter or prescription medicines as directed by your health care provider. Do not take other medicines for constipation without talking to your health care provider first.  [place] IF:   You have bright red blood in your stool.   Your constipation lasts for more than 4 days or gets worse.   You have abdominal or rectal pain.   You have thin, pencil-like stools.   You have unexplained weight loss. MAKE SURE YOU:   Understand these instructions.  Will watch your condition.  Will get help right away if you are not doing well or get worse.   This information is not intended to replace advice given to you by your health care provider. Make sure you discuss any questions you have with your health care provider.   Document Released: 05/12/2004 Document Revised: 09/04/2014 Document Reviewed: 05/26/2013 Elsevier Interactive Patient Education 2016 Elsevier Inc.  High-Fiber Diet Fiber, also called dietary fiber, is a type of carbohydrate found in fruits, vegetables, whole grains, and  beans. A high-fiber diet can have many health benefits. Your health care provider may recommend a high-fiber diet to help: °· Prevent constipation. Fiber can make your bowel movements more regular. °· Lower your cholesterol. °· Relieve hemorrhoids, uncomplicated diverticulosis, or irritable bowel syndrome. °· Prevent overeating as part of a weight-loss  plan. °· Prevent heart disease, type 2 diabetes, and certain cancers. °WHAT IS MY PLAN? °The recommended daily intake of fiber includes: °· 38 grams for men under age 50. °· 30 grams for men over age 50. °· 25 grams for women under age 50. °· 21 grams for women over age 50. °You can get the recommended daily intake of dietary fiber by eating a variety of fruits, vegetables, grains, and beans. Your health care provider may also recommend a fiber supplement if it is not possible to get enough fiber through your diet. °WHAT DO I NEED TO KNOW ABOUT A HIGH-FIBER DIET? °· Fiber supplements have not been widely studied for their effectiveness, so it is better to get fiber through food sources. °· Always check the fiber content on the nutrition facts label of any prepackaged food. Look for foods that contain at least 5 grams of fiber per serving. °· Ask your dietitian if you have questions about specific foods that are related to your condition, especially if those foods are not listed in the following section. °· Increase your daily fiber consumption gradually. Increasing your intake of dietary fiber too quickly may cause bloating, cramping, or gas. °· Drink plenty of water. Water helps you to digest fiber. °WHAT FOODS CAN I EAT? °Grains °Whole-grain breads. Multigrain cereal. Oats and oatmeal. Brown rice. Barley. Bulgur wheat. Millet. Bran muffins. Popcorn. Rye wafer crackers. °Vegetables °Sweet potatoes. Spinach. Kale. Artichokes. Cabbage. Broccoli. Green peas. Carrots. Squash. °Fruits °Berries. Pears. Apples. Oranges. Avocados. Prunes and raisins. Dried figs. °Meats and Other Protein Sources °Navy, kidney, pinto, and soy beans. Split peas. Lentils. Nuts and seeds. °Dairy °Fiber-fortified yogurt. °Beverages °Fiber-fortified soy milk. Fiber-fortified orange juice. °Other °Fiber bars. °The items listed above may not be a complete list of recommended foods or beverages. Contact your dietitian for more options. °WHAT FOODS  ARE NOT RECOMMENDED? °Grains °White bread. Pasta made with refined flour. White rice. °Vegetables °Fried potatoes. Canned vegetables. Well-cooked vegetables.  °Fruits °Fruit juice. Cooked, strained fruit. °Meats and Other Protein Sources °Fatty cuts of meat. Fried poultry or fried fish. °Dairy °Milk. Yogurt. Cream cheese. Sour cream. °Beverages °Soft drinks. °Other °Cakes and pastries. Butter and oils. °The items listed above may not be a complete list of foods and beverages to avoid. Contact your dietitian for more information. °WHAT ARE SOME TIPS FOR INCLUDING HIGH-FIBER FOODS IN MY DIET? °· Eat a wide variety of high-fiber foods. °· Make sure that half of all grains consumed each day are whole grains. °· Replace breads and cereals made from refined flour or white flour with whole-grain breads and cereals. °· Replace white rice with brown rice, bulgur wheat, or millet. °· Start the day with a breakfast that is high in fiber, such as a cereal that contains at least 5 grams of fiber per serving. °· Use beans in place of meat in soups, salads, or pasta. °· Eat high-fiber snacks, such as berries, raw vegetables, nuts, or popcorn. °  °This information is not intended to replace advice given to you by your health care provider. Make sure you discuss any questions you have with your health care provider. °  °Document Released: 08/14/2005 Document Revised: 09/04/2014 Document   Reviewed: 01/27/2014 Elsevier Interactive Patient Education 2016 Elsevier Inc.  Urinary Tract Infection Urinary tract infections (UTIs) can develop anywhere along your urinary tract. Your urinary tract is your body's drainage system for removing wastes and extra water. Your urinary tract includes two kidneys, two ureters, a bladder, and a urethra. Your kidneys are a pair of bean-shaped organs. Each kidney is about the size of your fist. They are located below your ribs, one on each side of your spine. CAUSES Infections are caused by microbes,  which are microscopic organisms, including fungi, viruses, and bacteria. These organisms are so small that they can only be seen through a microscope. Bacteria are the microbes that most commonly cause UTIs. SYMPTOMS  Symptoms of UTIs may vary by age and gender of the patient and by the location of the infection. Symptoms in young women typically include a frequent and intense urge to urinate and a painful, burning feeling in the bladder or urethra during urination. Older women and men are more likely to be tired, shaky, and weak and have muscle aches and abdominal pain. A fever may mean the infection is in your kidneys. Other symptoms of a kidney infection include pain in your back or sides below the ribs, nausea, and vomiting. DIAGNOSIS To diagnose a UTI, your caregiver will ask you about your symptoms. Your caregiver will also ask you to provide a urine sample. The urine sample will be tested for bacteria and white blood cells. White blood cells are made by your body to help fight infection. TREATMENT  Typically, UTIs can be treated with medication. Because most UTIs are caused by a bacterial infection, they usually can be treated with the use of antibiotics. The choice of antibiotic and length of treatment depend on your symptoms and the type of bacteria causing your infection. HOME CARE INSTRUCTIONS  If you were prescribed antibiotics, take them exactly as your caregiver instructs you. Finish the medication even if you feel better after you have only taken some of the medication.  Drink enough water and fluids to keep your urine clear or pale yellow.  Avoid caffeine, tea, and carbonated beverages. They tend to irritate your bladder.  Empty your bladder often. Avoid holding urine for long periods of time.  Empty your bladder before and after sexual intercourse.  After a bowel movement, women should cleanse from front to back. Use each tissue only once. SEEK MEDICAL CARE IF:   You have back  pain.  You develop a fever.  Your symptoms do not begin to resolve within 3 days. SEEK IMMEDIATE MEDICAL CARE IF:   You have severe back pain or lower abdominal pain.  You develop chills.  You have nausea or vomiting.  You have continued burning or discomfort with urination. MAKE SURE YOU:   Understand these instructions.  Will watch your condition.  Will get help right away if you are not doing well or get worse.   This information is not intended to replace advice given to you by your health care provider. Make sure you discuss any questions you have with your health care provider.   Document Released: 05/24/2005 Document Revised: 05/05/2015 Document Reviewed: 09/22/2011 Elsevier Interactive Patient Education 2016 Elsevier Inc.   Upper Respiratory Infection, Adult Most upper respiratory infections (URIs) are a viral infection of the air passages leading to the lungs. A URI affects the nose, throat, and upper air passages. The most common type of URI is nasopharyngitis and is typically referred to as "the common cold."  URIs run their course and usually go away on their own. Most of the time, a URI does not require medical attention, but sometimes a bacterial infection in the upper airways can follow a viral infection. This is called a secondary infection. Sinus and middle ear infections are common types of secondary upper respiratory infections. Bacterial pneumonia can also complicate a URI. A URI can worsen asthma and chronic obstructive pulmonary disease (COPD). Sometimes, these complications can require emergency medical care and may be life threatening.  CAUSES Almost all URIs are caused by viruses. A virus is a type of germ and can spread from one person to another.  RISKS FACTORS You may be at risk for a URI if:   You smoke.   You have chronic heart or lung disease.  You have a weakened defense (immune) system.   You are very young or very old.   You have nasal  allergies or asthma.  You work in crowded or poorly ventilated areas.  You work in health care facilities or schools. SIGNS AND SYMPTOMS  Symptoms typically develop 2-3 days after you come in contact with a cold virus. Most viral URIs last 7-10 days. However, viral URIs from the influenza virus (flu virus) can last 14-18 days and are typically more severe. Symptoms may include:   Runny or stuffy (congested) nose.   Sneezing.   Cough.   Sore throat.   Headache.   Fatigue.   Fever.   Loss of appetite.   Pain in your forehead, behind your eyes, and over your cheekbones (sinus pain).  Muscle aches.  DIAGNOSIS  Your health care provider may diagnose a URI by:  Physical exam.  Tests to check that your symptoms are not due to another condition such as:  Strep throat.  Sinusitis.  Pneumonia.  Asthma. TREATMENT  A URI goes away on its own with time. It cannot be cured with medicines, but medicines may be prescribed or recommended to relieve symptoms. Medicines may help:  Reduce your fever.  Reduce your cough.  Relieve nasal congestion. HOME CARE INSTRUCTIONS   Take medicines only as directed by your health care provider.   Gargle warm saltwater or take cough drops to comfort your throat as directed by your health care provider.  Use a warm mist humidifier or inhale steam from a shower to increase air moisture. This may make it easier to breathe.  Drink enough fluid to keep your urine clear or pale yellow.   Eat soups and other clear broths and maintain good nutrition.   Rest as needed.   Return to work when your temperature has returned to normal or as your health care provider advises. You may need to stay home longer to avoid infecting others. You can also use a face mask and careful hand washing to prevent spread of the virus.  Increase the usage of your inhaler if you have asthma.   Do not use any tobacco products, including cigarettes,  chewing tobacco, or electronic cigarettes. If you need help quitting, ask your health care provider. PREVENTION  The best way to protect yourself from getting a cold is to practice good hygiene.   Avoid oral or hand contact with people with cold symptoms.   Wash your hands often if contact occurs.  There is no clear evidence that vitamin C, vitamin E, echinacea, or exercise reduces the chance of developing a cold. However, it is always recommended to get plenty of rest, exercise, and practice good nutrition.  SEEK MEDICAL CARE IF:   You are getting worse rather than better.   Your symptoms are not controlled by medicine.   You have chills.  You have worsening shortness of breath.  You have brown or red mucus.  You have yellow or brown nasal discharge.  You have pain in your face, especially when you bend forward.  You have a fever.  You have swollen neck glands.  You have pain while swallowing.  You have white areas in the back of your throat. SEEK IMMEDIATE MEDICAL CARE IF:   You have severe or persistent:  Headache.  Ear pain.  Sinus pain.  Chest pain.  You have chronic lung disease and any of the following:  Wheezing.  Prolonged cough.  Coughing up blood.  A change in your usual mucus.  You have a stiff neck.  You have changes in your:  Vision.  Hearing.  Thinking.  Mood. MAKE SURE YOU:   Understand these instructions.  Will watch your condition.  Will get help right away if you are not doing well or get worse.   This information is not intended to replace advice given to you by your health care provider. Make sure you discuss any questions you have with your health care provider.   Document Released: 02/07/2001 Document Revised: 12/29/2014 Document Reviewed: 11/19/2013 Elsevier Interactive Patient Education 2016 [employer].    Emergency Department Resource Guide 1) Find a Doctor and Pay Out of Pocket Although you won't have  to find out who is covered by your insurance plan, it is a good idea to ask around and get recommendations. You will then need to call the office and see if the doctor you have chosen will accept you as a new patient and what types of options they offer for patients who are self-pay. Some doctors offer discounts or will set up payment plans for their patients who do not have insurance, but you will need to ask so you aren't surprised when you get to your appointment.  2) Contact Your Local Health Department Not all health departments have doctors that can see patients for sick visits, but many do, so it is worth a call to see if yours does. If you don't know where your local health department is, you can check in your phone book. The CDC also has a tool to help you locate your state's health department, and many state websites also have listings of all of their local health departments.  3) Find a Walk-in Clinic If your illness is not likely to be very severe or complicated, you may want to try a walk in clinic. These are popping up all over the country in pharmacies, drugstores, and shopping centers. They're usually staffed by nurse practitioners or physician assistants that have been trained to treat common illnesses and complaints. They're usually fairly quick and inexpensive. However, if you have serious medical issues or chronic medical problems, these are probably not your best option.  No Primary Care Doctor: - Call Health Connect at  [phone] - they can help you locate a primary care doctor that  accepts your insurance, provides certain services, etc. - Physician Referral Service- [phone]  Chronic Pain Problems: Organization         Address  Phone   Notes  [facility]  [phone] Patients need to be referred by their primary care doctor.   Medication Assistance: Retail buyer  Notes  [facility] Medication [facility] [address] [place] [place], [place] [address] [phone] --Must be a resident of [facility] -- Must have NO insurance coverage whatsoever (no Medicaid/ Medicare, etc.) -- The pt. MUST have a primary care doctor that directs their care regularly and follows them in the community   [facility]  [phone]   [employer]  [phone]    Agencies that provide inexpensive medical care: Organization         Address  Phone   Notes  [facility]  [phone]   [facility]    [phone]   [facility] [address] [place], [place] [address] [phone]   [facility] [address] [place], [place] [phone]   [facility]    [phone]   [facility]    [phone]   [facility]  [address], [place] Phone:  [phone], Fax:  [phone] Hours of Operation:  9 am - 6 pm, M-F.  Also accepts Medicaid/Medicare and self-pay.  [facility]  [address], [place] Phone: [phone], Fax: [phone]. Hours of Operation:  8:30 am - 5:30 pm, M-F.  Also accepts Medicaid and self-pay.  [facility] [place] [address], [place] Phone: [phone]   [facility] [address] [place], [place] [phone], Ext. [phone] Mondays & Thursdays: 7-9 AM.  First 15 patients are seen on a first come, first serve basis.    Medicaid-accepting [facility] Providers:  Organization         Address  Phone   Notes  [facility] [address], [place] [phone] Also accepts self-pay patients.  [facility] [address] [place], [place]  [phone]   [facility] [address], [place] [phone]   [facility] [address] [place] [phone]   Renaye Rakers [address], [place]   [phone] Only accepts [place] patients after they have their name applied to their card.   Self-Pay (no insurance) in [facility]:  Organization         Address  Phone   Notes  Sickle Cell Patients, [facility] [address] [place], [place] [phone]   [facility] [address] [place], [place] [phone]   [facility] [place]  [address], [place] [phone]   [facility]/Dr. Osei-Bonsu  [address], [place] or [address], [place] [phone] Phone number for both [place] and [place] locations is the same.  [facility] [address], [place] [phone]   [facility] [address] [place] or [address] [phone]   [facility] [address], [place] [phone], phone; [phone], fax Sees patients 1st and 3rd Saturday of every month.  Must not qualify for public or private insurance (i.e. Medicaid, Medicare, [place] Health Choice, Veterans' Benefits)  Household income should be no more than 200% of the poverty level The clinic cannot treat you if you are pregnant or think you are pregnant  Sexually transmitted  diseases are not treated at the clinic.    Dental Care: Organization         Address  Phone  Notes  [facility] [address], [place] [phone] Accepts children up to age 52 who are enrolled in [place] or [place] Health Choice; pregnant women with a Medicaid card; and children who have applied for Medicaid or [place] Health Choice, but were declined, whose parents can pay a reduced fee at time of service.  [facility] [place]  [address], [place] [phone] Accepts children up to age  52 who are enrolled in [place] or [place] Health Choice; pregnant women with a Medicaid card; and children who have applied for Medicaid or [place] Health Choice, but were declined, whose parents can pay a reduced fee at time of service.  [facility]  [address], [place] [phone] Patients are seen by appointment only. Walk-ins are not accepted. [facility] will see patients 52 years of age and older. Monday - Tuesday (8am-5pm) Most Wednesdays (8:30-5pm) $30 per visit, cash only  [facility]  [address], [facility] [place] [place] [phone] Patients are seen by appointment only. Walk-ins are not accepted. [facility] will see patients 52 years of age and older. One Wednesday Evening (Monthly: Volunteer Based).  $30 per visit, cash only  [facility]  [phone] for adults; Children under age 94, call Graduate Pediatric Dentistry at [phone]. Children aged 204-14, please call [phone] to request a pediatric application.  Dental services are provided in all areas of dental care including fillings, crowns and bridges, complete and partial dentures, implants, gum treatment, root canals, and extractions. Preventive care is also provided. Treatment is provided to both adults and children. Patients are selected via a lottery and there is often a waiting list.   [facility] [address], [place]  [phone] [email or web address]   [facility] [address], [place], [place] [phone], Ext. [phone] Second and Fourth Thursday of each month, opens at 6:30 AM; Clinic ends at 9 AM.  Patients are seen on a first-come first-served basis, and a limited number are seen during each clinic.   [facility]  [address], [place], [place] [phone]   Eligibility Requirements You must have lived in [place], [place], or [place] for at least the last three  months.   You cannot be eligible for state or federal sponsored [place] insurance, including [employer], [place], or [employer]icare.   You generally cannot be eligible for healthcare insurance through your employer.    How to apply: Eligibility screenings are held every Tuesday and Wednesday afternoon from 1:00 pm until 4:00 pm. You do not need an appointment for the interview!  [facility] [address], [place], [place] [phone]   [facility]  [phone]   [facility]  [phone]   [facility]  [phone]    Behavioral Health Resources in the Community: Intensive Outpatient Programs Organization         Address  Phone  Notes  [facility] [address], [place], [place] [phone]   [facility] [address], [place], [place] [phone]   [facility] [address], [place], [place]  [phone]  [facility] [address]  [place], [place] [phone] or [phone]   Substance Abuse Resources Organization         Address  Phone  Notes  [facility]  [phone]   [facility]  [phone]   [facility] [place]  [phone]   Floydene Flock  [phone]   Residential & Outpatient Substance Abuse Program  [phone]   Psychological Services Organization         Address  Phone  Notes  [facility]  [phone]   [facility]  [phone]   [facility] [address], [place] [phone] or [phone]    Mobile Crisis Teams Organization         Address  Phone  Notes  [facility], Mobile Crisis Care Unit  [phone]   [facility]  [address] [place], [place] [phone]   Doristine Locks [address] [place] [phone]    Self-Help/Support  Groups Organization         Address  Phone             Notes  [facility] [place] - variety of support groups  [phone]- [phone] Call for more information  Narcotics Anonymous (NA), [facility] [address], [employer] [place]  2 meetings at this location   Statistician         Address  Phone  Notes  [facility] [address],    [place]  [phone]   [facility]  [address], [place] [address], [place], [place] [phone]   [facility] [address] [place], [place] [phone] Admissions: 8am-3pm M-F  [facility] [address],    [place], [place] [phone]   [facility] [address] [place], [place], [place] [phone]   [facility] [address]  [place], [place] [phone]   [facility] - Intensive Outpatient [address], [place], [place] [phone]   [facility] ([facility] [address] [place]  [place], [place] [phone] or [phone]   [facility] ([facility]) [address], [place], [place] [phone] Accepts Medicaid  [facility] [address],  [place] [phone] Substance Abuse/Addiction Treatment   [facility] Organization         Address  Phone  Notes  [facility]  [phone]   Angie Fava, PhD [address] [place], [place]   [phone] or [phone]   [facility] Behavioral   [address] [place], [place] [phone]   [facility] [address], [place], [place] [phone] Insurance/Medicaid/sponsorship through [facility] [address]                                    [place], [place] [phone] Therapy/tele-psych/case  [facility] [address], [place] [phone]    Dr. Lolly Mustache  [phone]   [facility]  [facility] Dept. 1) [address] [place] 2) [address] [place] 3)  [address], [place] [phone]  [phone]   [facility] Child Abuse Hotline [phone] or [phone] (After Hours)

## 2015-07-05 NOTE — ED Provider Notes (Signed)
TIME SEEN: 6:00 AM  CHIEF COMPLAINT: Multiple complaints  HPI: Pt is a 54 y.o. female with no known past medical history who presents to the emergency department with multiple different complaints.  First patient complains of 2 days of nasal congestion, sore throat. Denies any cough. No fever. No known sick contacts. Has also had a diffuse throbbing headache.  Patient also complains of 2-3 months of intermittent episodes of shortness of breath and lightheadedness with near syncopal events. No chest pain or chest discomfort. States symptoms come on with movement. She has no shortness of breath currently. No history of PE or DVT. No recent prolonged immobilization, fracture, surgery, trauma, exogenous estrogen use. She is not a smoker. No lower extremity swelling or pain. No family history of premature CAD. She has no history of hypertension, diabetes or hyperlipidemia.  Patient also complaining of several days of constipation. She is having abdominal cramping. She is passing gas. No nausea, vomiting or diarrhea. No dysuria, hematuria, vaginal bleeding or discharge. Last menstrual period was 3 months ago. She has had a BTL, C-section and D&C.  When asked who patient's primary care [facility] as [facility].  ROS: See HPI Constitutional: no fever  Eyes: no drainage  ENT:  runny nose   Cardiovascular:  no chest pain  Resp: SOB  GI: no vomiting GU: no dysuria Integumentary: no rash  Allergy: no hives  Musculoskeletal: no leg swelling  Neurological: no slurred speech ROS otherwise negative  PAST MEDICAL HISTORY/PAST SURGICAL HISTORY:  History reviewed. No pertinent past medical history.  MEDICATIONS:  Prior to Admission medications   Medication Sig Start Date End Date Taking? Authorizing Provider  aspirin 325 MG tablet Take 650 mg by mouth daily as needed for pain.    Historical Provider, MD  cyclobenzaprine (FLEXERIL) 5 MG tablet Take 1 tablet (5 mg total)  by mouth at bedtime as needed for muscle spasms. 06/11/13   Ramond MarrowPeter J Sumner, DO  IBUPROFEN PO Take by mouth as needed.    Historical Provider, MD  Multiple Vitamin (MULTIVITAMIN WITH MINERALS) TABS tablet Take 1 tablet by mouth daily.    Historical Provider, MD    ALLERGIES:  Allergies  Allergen Reactions  . Food Other (See Comments)    Chocolate,caramel,peanuts causes throat itching    SOCIAL HISTORY:  Social History  Substance Use Topics  . Smoking status: Never Smoker   . Smokeless tobacco: Never Used  . Alcohol Use: No    FAMILY HISTORY: Family History  Problem Relation Age of Onset  . Hypertension Mother   . Diabetes Other     EXAM: BP 159/94 mmHg  Pulse 77  Temp(Src) 98 F (36.7 C) (Oral)  Resp 16  Ht 5\' 7"  (1.702 m)  Wt 180 lb (81.647 kg)  BMI 28.19 kg/m2  SpO2 99% CONSTITUTIONAL: Alert and oriented and responds appropriately to questions. Well-appearing; well-nourished, nontoxic, afebrile HEAD: Normocephalic EYES: Conjunctivae clear, PERRL ENT: normal nose; no rhinorrhea; moist mucous membranes; pharynx without lesions noted, no tonsillar hypertrophy or exudate, no uvular deviation, no trismus or drooling, no Ludwig's angina, tongue sits flat in the bottom of the mouth, normal phonation, no stridor NECK: Supple, no meningismus, no LAD  CARD: RRR; S1 and S2 appreciated; no murmurs, no clicks, no rubs, no gallops RESP: Normal chest excursion without splinting or tachypnea; breath sounds clear and equal bilaterally; no wheezes, no rhonchi, no rales, no hypoxia or respiratory distress, speaking full sentences ABD/GI: Normal bowel sounds; non-distended; soft,  non-tender, no rebound, no guarding, no peritoneal signs, no tympany or fluid wave BACK:  The back appears normal and is non-tender to palpation, there is no CVA tenderness EXT: Normal ROM in all joints; non-tender to palpation; no edema; normal capillary refill; no cyanosis, no calf tenderness or swelling     SKIN: Normal color for age and race; warm NEURO: Moves all extremities equally, sensation to light touch intact diffusely, cranial nerves II through XII intact PSYCH: The patient's mood and manner are appropriate. Grooming and personal hygiene are appropriate.  MEDICAL DECISION MAKING: Patient here with multiple complaints. Differential diagnosis is large given her multiple different complaints. However she is very well-appearing on exam with normal vital signs and a benign abdomen. We'll obtain labs including troponin, d-dimer. Will obtain abdominal series, urinalysis. We'll give Toradol for pain, IV fluids for her dizziness. EKG shows no ischemic changes, arrhythmia or interval changes.  ED PROGRESS: Patient's labs are unremarkable. Normal hemoglobin, electrolytes, LFTs, troponin, d-dimer. She denies any chest pain. Denies shortness of breath currently. She does have a nitrite-positive UTI. Culture pending. Will treat with ceftriaxone. Urine pregnancy test is negative. X-ray shows no free air, obstruction, infiltrate. I feel patient is safe to be discharged home. Have recommended close outpatient follow-up and have provided her with multiple different primary care physicians to contact. Discussed with patient that her lightheadedness may be secondary to orthostasis. Recommended she increase her fluid intake, avoid caffeine and alcohol, take time to start walking after changing position from a seated or lying flat position to a standing position. She verbalizes understanding and is comfortable with this plan.   EKG Interpretation  Date/Time:  Monday July 05 2015 05:53:59 EST Ventricular Rate:  75 PR Interval:  174 QRS Duration: 69 QT Interval:  392 QTC Calculation: 438 R Axis:   36 Text Interpretation:  Sinus rhythm No significant change since last tracing Confirmed by Conor Filsaime,  DO, Lourdes Manning [phone]) on 07/05/2015 6:01:37 AM         Layla Maw Mehak Roskelley, DO 07/05/15 0272

## 2015-07-05 NOTE — ED Notes (Signed)
Patient ambulated to restroom and tolerated well.  

## 2015-07-05 NOTE — ED Notes (Signed)
Called lab and notified of preg add on.

## 2015-07-07 LAB — URINE CULTURE: Culture: >=100000

## 2015-07-09 ENCOUNTER — Telehealth: Payer: Self-pay | Admitting: *Deleted

## 2015-07-09 NOTE — ED Notes (Signed)
(+)  urine culture, treated with Cefazolin, no further treatment needed, Robert Vincent, PharmD
